# Patient Record
Sex: Female | Born: 1955 | Race: White | Hispanic: No | Marital: Married | Smoking: Never smoker
Health system: Southern US, Community
[De-identification: ages and names within clinical notes are randomized; demographics above are authoritative.]

## PROBLEM LIST (undated history)

## (undated) DIAGNOSIS — R7303 Prediabetes: Secondary | ICD-10-CM

## (undated) DIAGNOSIS — K589 Irritable bowel syndrome without diarrhea: Secondary | ICD-10-CM

## (undated) DIAGNOSIS — K219 Gastro-esophageal reflux disease without esophagitis: Secondary | ICD-10-CM

## (undated) DIAGNOSIS — E039 Hypothyroidism, unspecified: Secondary | ICD-10-CM

## (undated) DIAGNOSIS — D510 Vitamin B12 deficiency anemia due to intrinsic factor deficiency: Secondary | ICD-10-CM

## (undated) DIAGNOSIS — E782 Mixed hyperlipidemia: Secondary | ICD-10-CM

## (undated) HISTORY — DX: Vitamin B12 deficiency anemia due to intrinsic factor deficiency: D51.0

## (undated) HISTORY — DX: Mixed hyperlipidemia: E78.2

## (undated) HISTORY — DX: Hypothyroidism, unspecified: E03.9

## (undated) HISTORY — DX: Irritable bowel syndrome, unspecified: K58.9

## (undated) HISTORY — DX: Prediabetes: R73.03

## (undated) HISTORY — PX: ABDOMINAL HYSTERECTOMY: SHX81

---

## 1995-09-20 HISTORY — PX: TUBAL LIGATION: SHX77

## 1999-06-02 ENCOUNTER — Other Ambulatory Visit: Admission: RE | Admit: 1999-06-02 | Discharge: 1999-06-02 | Payer: Self-pay | Admitting: *Deleted

## 2001-06-11 ENCOUNTER — Ambulatory Visit (HOSPITAL_COMMUNITY): Admission: RE | Admit: 2001-06-11 | Discharge: 2001-06-11 | Payer: Self-pay | Admitting: Family Medicine

## 2001-06-11 ENCOUNTER — Encounter: Payer: Self-pay | Admitting: Family Medicine

## 2001-11-28 ENCOUNTER — Other Ambulatory Visit: Admission: RE | Admit: 2001-11-28 | Discharge: 2001-11-28 | Payer: Self-pay | Admitting: Obstetrics and Gynecology

## 2002-12-26 ENCOUNTER — Encounter: Payer: Self-pay | Admitting: Family Medicine

## 2002-12-26 ENCOUNTER — Ambulatory Visit (HOSPITAL_COMMUNITY): Admission: RE | Admit: 2002-12-26 | Discharge: 2002-12-26 | Payer: Self-pay | Admitting: Family Medicine

## 2003-06-03 ENCOUNTER — Encounter (INDEPENDENT_AMBULATORY_CARE_PROVIDER_SITE_OTHER): Payer: Self-pay | Admitting: Internal Medicine

## 2003-06-03 ENCOUNTER — Ambulatory Visit (HOSPITAL_COMMUNITY): Admission: RE | Admit: 2003-06-03 | Discharge: 2003-06-03 | Payer: Self-pay | Admitting: Internal Medicine

## 2003-08-21 ENCOUNTER — Ambulatory Visit (HOSPITAL_COMMUNITY): Admission: RE | Admit: 2003-08-21 | Discharge: 2003-08-21 | Payer: Self-pay | Admitting: Obstetrics and Gynecology

## 2003-08-21 ENCOUNTER — Encounter (INDEPENDENT_AMBULATORY_CARE_PROVIDER_SITE_OTHER): Payer: Self-pay | Admitting: *Deleted

## 2004-02-25 ENCOUNTER — Other Ambulatory Visit: Admission: RE | Admit: 2004-02-25 | Discharge: 2004-02-25 | Payer: Self-pay | Admitting: Obstetrics and Gynecology

## 2004-03-01 ENCOUNTER — Ambulatory Visit (HOSPITAL_COMMUNITY): Admission: RE | Admit: 2004-03-01 | Discharge: 2004-03-01 | Payer: Self-pay | Admitting: Obstetrics and Gynecology

## 2004-12-06 ENCOUNTER — Ambulatory Visit (HOSPITAL_COMMUNITY): Admission: RE | Admit: 2004-12-06 | Discharge: 2004-12-06 | Payer: Self-pay | Admitting: Family Medicine

## 2005-09-19 HISTORY — PX: DILATION AND CURETTAGE OF UTERUS: SHX78

## 2005-11-01 ENCOUNTER — Emergency Department (HOSPITAL_COMMUNITY): Admission: EM | Admit: 2005-11-01 | Discharge: 2005-11-01 | Payer: Self-pay | Admitting: Emergency Medicine

## 2010-04-15 ENCOUNTER — Ambulatory Visit (HOSPITAL_COMMUNITY)
Admission: RE | Admit: 2010-04-15 | Discharge: 2010-04-15 | Payer: Self-pay | Source: Home / Self Care | Admitting: Family Medicine

## 2010-10-04 ENCOUNTER — Ambulatory Visit (HOSPITAL_COMMUNITY)
Admission: RE | Admit: 2010-10-04 | Discharge: 2010-10-04 | Payer: Self-pay | Source: Home / Self Care | Attending: Family Medicine | Admitting: Family Medicine

## 2010-10-10 ENCOUNTER — Encounter: Payer: Self-pay | Admitting: Family Medicine

## 2011-01-31 ENCOUNTER — Ambulatory Visit (INDEPENDENT_AMBULATORY_CARE_PROVIDER_SITE_OTHER): Payer: BC Managed Care – PPO | Admitting: Gynecology

## 2011-01-31 ENCOUNTER — Other Ambulatory Visit (HOSPITAL_COMMUNITY)
Admission: RE | Admit: 2011-01-31 | Discharge: 2011-01-31 | Disposition: A | Discharge status: Home / Self Care | Payer: BC Managed Care – PPO | Source: Ambulatory Visit | Attending: Gynecology | Admitting: Gynecology

## 2011-01-31 ENCOUNTER — Other Ambulatory Visit: Payer: Self-pay | Admitting: Gynecology

## 2011-01-31 DIAGNOSIS — N950 Postmenopausal bleeding: Secondary | ICD-10-CM

## 2011-01-31 DIAGNOSIS — N952 Postmenopausal atrophic vaginitis: Secondary | ICD-10-CM

## 2011-01-31 DIAGNOSIS — Z124 Encounter for screening for malignant neoplasm of cervix: Secondary | ICD-10-CM | POA: Insufficient documentation

## 2011-01-31 DIAGNOSIS — Z833 Family history of diabetes mellitus: Secondary | ICD-10-CM

## 2011-02-01 ENCOUNTER — Other Ambulatory Visit: Payer: Self-pay | Admitting: Gynecology

## 2011-02-01 DIAGNOSIS — Z1231 Encounter for screening mammogram for malignant neoplasm of breast: Secondary | ICD-10-CM

## 2011-02-04 NOTE — Op Note (Signed)
[name], Rachel Branch                          ACCOUNT NO.:  [id number]   MEDICAL RECORD NO.:  [id number]                   PATIENT TYPE:  AMB   LOCATION:  SDC                                  FACILITY:  [facility]   PHYSICIAN:  Janine Limbo, M.D.            DATE OF BIRTH:  1956-02-26   DATE OF PROCEDURE:  08/21/2003  DATE OF DISCHARGE:                                 OPERATIVE REPORT   PREOPERATIVE DIAGNOSIS:  Irregular bleeding.   POSTOPERATIVE DIAGNOSIS:  Irregular bleeding.   PROCEDURE:  Hysteroscopy with dilatation and curettage.   SURGEON:  Janine Limbo, M.D.   ANESTHESIA:  Monitored anesthetic control, paracervical block using 0.5%  Marcaine with epinephrine.   DISPOSITION:  [name] is a 55 year old female, para 0-3-1-3, who presents  with irregular bleeding in spite of hormone therapy.  She understands the  indications for her procedure and she accepts the associated risks.   FINDINGS:  The endometrial cavity appeared normal.  The endometrial cavity  sounded to 9 cm.   DESCRIPTION OF PROCEDURE:  The patient was taken to the operating room,  where she was given medication through her IV line.  The patient's perineum  and vagina were prepped with multiple layers of Betadine.  A Foley catheter  was placed in the bladder.  Examination under anesthesia was performed.  The  patient was sterilely draped.  A speculum was placed in the vagina and a paracervical block was placed  using 10 mL of 0.5% Marcaine with epinephrine.  An endocervical curettage  was performed.  The uterus sounded to 9 cm.  The cervix was gradually  dilated.  The diagnostic hysteroscope was inserted and the cavity was  carefully inspected.  Pictures were taken of the endometrial cavity.  The  hysteroscope was removed and the cavity was then curetted using a medium  sharp curette.  The cavity was felt to be clean at the end of our procedure.  The hysteroscope was then reinserted and the cavity was  inspected.  No  lesions were present, and the cavity appeared to be clean.  All instruments  were removed.  The estimated blood loss was 10 mL.  Sponge, needle, and  instrument counts were correct.  The patient was returned to the supine  position and then she was taken to the recovery room in stable condition.  She tolerated her procedure well.   FOLLOW-UP INSTRUCTIONS:  The patient was given Darvocet-N 100, and she will  take one tablet every six hours as needed for pain.  She will use ibuprofen  400-600 mg q.6h. as needed for mild to moderate discomfort.  The patient  will return to see Dr. Stefano Gaul in two to three weeks for follow-up  examination.  She was given a copy of the postoperative instruction sheet as  prepared by the [facility] for patients who have  undergone a dilatation and curettage.  Janine Limbo, M.D.    AVS/MEDQ  D:  08/21/2003  T:  08/21/2003  Job:  [id number]

## 2011-02-04 NOTE — H&P (Signed)
NAME:  Rachel Branch, Rachel Branch                          ACCOUNT NO.:  [id number]   MEDICAL RECORD NO.:  [id number]                   PATIENT TYPE:  AMB   LOCATION:  SDC                                  FACILITY:  [facility]   PHYSICIAN:  Janine Limbo, M.D.            DATE OF BIRTH:  01/22/1956   DATE OF ADMISSION:  DATE OF DISCHARGE:                                HISTORY & PHYSICAL   DATE OF SURGERY:  August 21, 2003   HISTORY OF PRESENT ILLNESS:  Rachel Branch is a 55 year old female para 0-3-1-3  who presents for hysteroscopy and D&C because of irregular bleeding.  The  patient has had irregular bleeding in spite of oral contraceptives.  An  endometrial biopsy was performed that showed benign elements.  The patient's  TSH, FSH, and prolactin were normal.  Her most recent Pap smear was within  normal limits.  A CT scan was performed which showed diverticulosis but no  evidence of diverticulitis.   OBSTETRICAL HISTORY:  The patient has had three preterm vaginal deliveries  and one miscarriage.   PAST MEDICAL HISTORY:  The patient has been diagnosed in the past as  hypothyroid and she currently takes Synthroid.  She also takes B12  injections and Axert.  She complains of frequent headaches as well as  trouble with her stomach.  She has had a D&C in the past as well as  laparoscopy.  She has had a tubal ligation in the past.   DRUG ALLERGIES:  No known drug allergies.   SOCIAL HISTORY:  The patient denies cigarette use, alcohol use, and  recreational drug use.   REVIEW OF SYSTEMS:  The patient has a history of headaches as mentioned  above.  She also has PMS-type symptoms.   FAMILY HISTORY:  There is a family history of heart disease, thyroid  disease, migraine headaches, diabetes, hypertension, emotional issues, and  thromboembolic difficulties.   PHYSICAL EXAMINATION:  VITAL SIGNS:  Weight is 202 pounds.  HEENT:  Within normal limits.  CHEST:  Clear.  HEART:  Regular rate and  rhythm.  BREASTS:  Without masses.  ABDOMEN:  Nontender.  EXTREMITIES:  Within normal limits.  NEUROLOGIC:  Grossly normal.  PELVIC:  External genitalia is normal.  The vagina is normal except for  pelvic relaxation.  Cervix is nontender.  The uterus is upper limits normal  size and nontender.  Adnexa no masses and rectovaginal exam confirms.   ASSESSMENT:  Irregular menstrual bleeding.   PLAN:  The patient will undergo a hysteroscopy, dilatation, and curettage.  She understands the indications for her procedure and she accepts the risks  of, but not limited to, anesthetic complications, bleeding, infections, and  the possible damage to the surrounding organs.  Janine Limbo, M.D.    AVS/MEDQ  D:  08/16/2003  T:  08/16/2003  Job:  [id number]

## 2011-02-10 ENCOUNTER — Ambulatory Visit (HOSPITAL_COMMUNITY)
Admission: RE | Admit: 2011-02-10 | Discharge: 2011-02-10 | Disposition: A | Discharge status: Home / Self Care | Payer: BC Managed Care – PPO | Source: Ambulatory Visit | Attending: Gynecology | Admitting: Gynecology

## 2011-02-10 DIAGNOSIS — Z1231 Encounter for screening mammogram for malignant neoplasm of breast: Secondary | ICD-10-CM | POA: Insufficient documentation

## 2011-02-16 ENCOUNTER — Ambulatory Visit (INDEPENDENT_AMBULATORY_CARE_PROVIDER_SITE_OTHER): Payer: BC Managed Care – PPO | Admitting: Gynecology

## 2011-02-16 ENCOUNTER — Other Ambulatory Visit: Payer: Self-pay | Admitting: Gynecology

## 2011-02-16 ENCOUNTER — Other Ambulatory Visit: Payer: BC Managed Care – PPO

## 2011-02-16 DIAGNOSIS — N83339 Acquired atrophy of ovary and fallopian tube, unspecified side: Secondary | ICD-10-CM

## 2011-02-16 DIAGNOSIS — N950 Postmenopausal bleeding: Secondary | ICD-10-CM

## 2011-02-16 DIAGNOSIS — N840 Polyp of corpus uteri: Secondary | ICD-10-CM

## 2011-02-16 DIAGNOSIS — N949 Unspecified condition associated with female genital organs and menstrual cycle: Secondary | ICD-10-CM

## 2011-03-09 ENCOUNTER — Other Ambulatory Visit (HOSPITAL_COMMUNITY): Payer: Self-pay | Admitting: Endocrinology

## 2011-03-09 DIAGNOSIS — E049 Nontoxic goiter, unspecified: Secondary | ICD-10-CM

## 2011-03-14 HISTORY — PX: COMBINED HYSTEROSCOPY DIAGNOSTIC / D&C: SUR297

## 2011-03-17 ENCOUNTER — Other Ambulatory Visit: Payer: Self-pay | Admitting: Gynecology

## 2011-03-17 ENCOUNTER — Ambulatory Visit (HOSPITAL_BASED_OUTPATIENT_CLINIC_OR_DEPARTMENT_OTHER)
Admission: RE | Admit: 2011-03-17 | Discharge: 2011-03-17 | Disposition: A | Discharge status: Home / Self Care | Payer: BC Managed Care – PPO | Source: Ambulatory Visit | Attending: Gynecology | Admitting: Gynecology

## 2011-03-17 DIAGNOSIS — Z79899 Other long term (current) drug therapy: Secondary | ICD-10-CM | POA: Insufficient documentation

## 2011-03-17 DIAGNOSIS — N950 Postmenopausal bleeding: Secondary | ICD-10-CM | POA: Insufficient documentation

## 2011-03-17 DIAGNOSIS — E039 Hypothyroidism, unspecified: Secondary | ICD-10-CM | POA: Insufficient documentation

## 2011-03-17 DIAGNOSIS — K589 Irritable bowel syndrome without diarrhea: Secondary | ICD-10-CM | POA: Insufficient documentation

## 2011-03-17 DIAGNOSIS — D510 Vitamin B12 deficiency anemia due to intrinsic factor deficiency: Secondary | ICD-10-CM | POA: Insufficient documentation

## 2011-03-17 DIAGNOSIS — N840 Polyp of corpus uteri: Secondary | ICD-10-CM

## 2011-03-17 DIAGNOSIS — R9431 Abnormal electrocardiogram [ECG] [EKG]: Secondary | ICD-10-CM | POA: Insufficient documentation

## 2011-03-17 LAB — POCT HEMOGLOBIN-HEMACUE: Hemoglobin: 13.7 g/dL (ref 12.0–15.0)

## 2011-03-18 NOTE — Op Note (Signed)
  Rachel Branch, Rachel Branch                ACCOUNT NO.:  [id number]  MEDICAL RECORD NO.:  [id number]  LOCATION:                                 FACILITY:  PHYSICIAN:  Baillie Mohammad P. [place], M.D.DATE OF BIRTH:  04-Apr-1956  DATE OF PROCEDURE:  03/17/2011 DATE OF DISCHARGE:                              OPERATIVE REPORT   PREOPERATIVE DIAGNOSES:  Postmenopausal bleeding, endometrial polyp.  POSTOPERATIVE DIAGNOSES:  Postmenopausal bleeding, endometrial polyp.  PROCEDURES:  Hysteroscopy, endometrial polypectomy, dilatation and curettage.  SURGEON:  Teshawn Moan P. Masako Overall, M.D.  ANESTHESIA:  General with 1% lidocaine paracervical block.  COMPLICATIONS:  None.  ESTIMATED BLOOD LOSS:  Minimal.  DISTENDED MEDIA DISCREPANCY:  Minimal.  SPECIMENS: 1. Endometrial polyp. 2. Endometrial curetting to pathology.  FINDINGS:  EUA, external BUS and vagina normal.  Cervix normal. Bimanual uterus normal sized, midline, mobile.  Adnexa without masses. Hysteroscopic, large endometrial polyp filling the cavity.  After removal, endometrial cavity was empty.  Postmenopausal appearance, noting fundus anterior-posterior uterine surface and lower uterine segment, endocervical canal, right and left tubal ostia all visualized.  DESCRIPTION OF PROCEDURE:  The patient was taken to the operating room, underwent general anesthesia, placed in low dorsal lithotomy position, received a perineal vaginal preparation with Betadine solution.  Bladder emptied with in-and-out Foley catheterization.  EUA performed.  The patient was draped in usual fashion.  The cervix was visualized with a weighted speculum.  Anterior lip grasped with single-tooth tenaculum. Paracervical block using 1% lidocaine was placed, total of 10 cc.  The cervix was gently dilated to admit the operative hysteroscope and hysteroscopy was performed with findings noted above.  The patient had a large endometrial polyp filling the cavity.  The  hysteroscope was removed.  Small ring forceps introduced into the uterine cavity, opened and closed and the polyp was removed in its entirety.  A sharp curettage was then performed and the specimen was sent separately to pathology. Hysteroscopy showed an empty cavity, good distention, no evidence of perforation.  The instruments were removed.  Hemostasis visualized from the cervical os and the tenaculum site.  The speculum removed.  The patient placed in supine position, received intraoperative Toradol and was awakened without difficulty and taken to recovery room in good condition having tolerated the procedure well.     Massa Pe P. Audie Box, M.D.     TPF/MEDQ  D:  03/17/2011  T:  03/17/2011  Job:  [id number]  Electronically Signed by Colin Broach M.D. on 03/18/2011 06:49:02 PM

## 2011-03-18 NOTE — H&P (Signed)
  Rachel, Branch                ACCOUNT NO.:  [id number]  MEDICAL RECORD NO.:  [id number]  LOCATION:                                 FACILITY:  PHYSICIAN:  Emmarie Sannes P. Jacquiline Zurcher, M.D.DATE OF BIRTH:  1956/06/08  DATE OF ADMISSION: DATE OF DISCHARGE:                             HISTORY & PHYSICAL   Being admitted to [facility] on March 17, 2011, at 7:30 a.m. for surgery.  CHIEF COMPLAINT:  Postmenopausal bleeding.  HISTORY OF PRESENT ILLNESS:  55 year old G66, P3, AB1 female status post BTL, history of menopause approximately a year and a half before presentation, who presented with a 38-month history of bleeding on and off.  The patient's outpatient evaluation included elevated FSH and a sonohysterogram showing several endometrial defects consistent with polyps.  The patient is admitted at this time for hysteroscopy, D and C, removal of her endometrial polyps.  PAST MEDICAL HISTORY:  Includes irritable bowel syndrome, hypothyroidism, pernicious anemia.  PAST SURGICAL HISTORY:  Includes D and C in 2007, BTL in 1997.  CURRENT MEDICATIONS:  Include B12 shots, Synthroid 0.125 daily.  ALLERGIES:  No medications.  REVIEW OF SYSTEMS:  Noncontributory.  FAMILY HISTORY:  Noncontributory.  SOCIAL HISTORY:  Noncontributory.  PHYSICAL EXAMINATION:  VITAL SIGNS:  Admission physical exam, afebrile. Vital signs are stable. HEENT:  Normal. LUNGS:  Clear. CARDIAC:  Regular rate without rubs, murmurs, or gallops. ABDOMINAL EXAM:  Benign. PELVIC:  External BUS, vagina with mild atrophic changes.  Cervix grossly normal.  Uterus normal size, midline, mobile, nontender.  Adnexa without masses or tenderness.  ASSESSMENT:  A 55 year old G19, P3, AB1 female status post tubal sterilization over a year and a half, postmenopausal with on and off bleeding for 3 months.  Sonohysterogram consistent with endometrial polyps and she is admitted for hysteroscopy, D and C.  I reviewed  the proposed surgery with the patient to include the expected intraoperative and postoperative courses, instrumentation, use of the hysteroscope, resectoscope and D and C portion.  The risks of infection, prolonged antibiotics, hemorrhage necessitating transfusion.  The risks of transfusion to include transfusion reaction, hepatitis, HIV, mad cow disease and other unknown entities were all discussed, understood, and accepted.  The risk of uterine perforation, damage to internal organs either immediately recognized or delay recognized including bowel, bladder, ureters, vessels, and nerves necessitating major exploratory reparative surgeries and future reparative surgeries including ostomy formation, bowel resection, bladder repair, ureteral damage repair was all discussed, understood and accepted.  The issues of distended media absorption leading to metabolic complications such as comas and seizures were also reviewed with her.  Her questions were answered and she is ready to proceed with surgery.     Symphoni Helbling P. Audie Box, M.D.     TPF/MEDQ  D:  03/14/2011  T:  03/14/2011  Job:  [id number]  Electronically Signed by Colin Broach M.D. on 03/18/2011 06:48:55 PM

## 2011-03-31 ENCOUNTER — Encounter: Payer: Self-pay | Admitting: Gynecology

## 2011-03-31 ENCOUNTER — Ambulatory Visit (INDEPENDENT_AMBULATORY_CARE_PROVIDER_SITE_OTHER): Payer: BC Managed Care – PPO | Admitting: Gynecology

## 2011-03-31 DIAGNOSIS — Z9889 Other specified postprocedural states: Secondary | ICD-10-CM

## 2011-05-09 ENCOUNTER — Ambulatory Visit (HOSPITAL_COMMUNITY)
Admission: RE | Admit: 2011-05-09 | Discharge: 2011-05-09 | Disposition: A | Discharge status: Home / Self Care | Payer: BC Managed Care – PPO | Source: Ambulatory Visit | Attending: Endocrinology | Admitting: Endocrinology

## 2011-05-09 DIAGNOSIS — E049 Nontoxic goiter, unspecified: Secondary | ICD-10-CM | POA: Insufficient documentation

## 2012-05-02 ENCOUNTER — Other Ambulatory Visit: Payer: Self-pay | Admitting: Gynecology

## 2012-05-02 DIAGNOSIS — Z1231 Encounter for screening mammogram for malignant neoplasm of breast: Secondary | ICD-10-CM

## 2012-05-09 ENCOUNTER — Ambulatory Visit (INDEPENDENT_AMBULATORY_CARE_PROVIDER_SITE_OTHER): Payer: BC Managed Care – PPO | Admitting: Gynecology

## 2012-05-09 ENCOUNTER — Encounter: Payer: Self-pay | Admitting: Gynecology

## 2012-05-09 VITALS — BP 120/90 | Ht 65.75 in | Wt 224.0 lb

## 2012-05-09 DIAGNOSIS — Z780 Asymptomatic menopausal state: Secondary | ICD-10-CM

## 2012-05-09 DIAGNOSIS — Z01419 Encounter for gynecological examination (general) (routine) without abnormal findings: Secondary | ICD-10-CM

## 2012-05-09 DIAGNOSIS — Z13220 Encounter for screening for lipoid disorders: Secondary | ICD-10-CM

## 2012-05-09 DIAGNOSIS — Z131 Encounter for screening for diabetes mellitus: Secondary | ICD-10-CM

## 2012-05-09 LAB — CBC WITH DIFFERENTIAL/PLATELET
Basophils Absolute: 0 K/uL (ref 0.0–0.1)
Basophils Relative: 0 % (ref 0–1)
Eosinophils Absolute: 0.2 K/uL (ref 0.0–0.7)
Eosinophils Relative: 2 % (ref 0–5)
HCT: 42.1 % (ref 36.0–46.0)
Hemoglobin: 14.4 g/dL (ref 12.0–15.0)
Lymphocytes Relative: 19 % (ref 12–46)
Lymphs Abs: 2.1 K/uL (ref 0.7–4.0)
MCH: 30.3 pg (ref 26.0–34.0)
MCHC: 34.2 g/dL (ref 30.0–36.0)
MCV: 88.4 fL (ref 78.0–100.0)
Monocytes Absolute: 0.9 K/uL (ref 0.1–1.0)
Monocytes Relative: 8 % (ref 3–12)
Neutro Abs: 7.9 K/uL — ABNORMAL HIGH (ref 1.7–7.7)
Neutrophils Relative %: 71 % (ref 43–77)
Platelets: 325 K/uL (ref 150–400)
RBC: 4.76 MIL/uL (ref 3.87–5.11)
RDW: 14 % (ref 11.5–15.5)
WBC: 11.1 K/uL — ABNORMAL HIGH (ref 4.0–10.5)

## 2012-05-09 NOTE — Patient Instructions (Signed)
Follow up for bone density as scheduled. Follow up for annual gynecologic exam. Schedule your colonoscopy as recommended. Follow up for your mammogram as scheduled.

## 2012-05-09 NOTE — Progress Notes (Signed)
STAYCE DELANCY 02-17-1956 [id number]        56 y.o.  G4P0013 for annual exam.  Several issues noted below.  Past medical history,surgical history, medications, allergies, family history and social history were all reviewed and documented in the EPIC chart. ROS:  Was performed and pertinent positives and negatives are included in the history.  Exam: Amy assistant Filed Vitals:   05/09/12 0958  BP: 120/90  Height: 5' 5.75" (1.67 m)  Weight: 224 lb (101.606 kg)   General appearance  Normal Skin grossly normal Head/Neck normal with no cervical or supraclavicular adenopathy thyroid normal Lungs  clear Cardiac RR, without RMG Abdominal  soft, nontender, without masses, organomegaly or hernia Breasts  examined lying and sitting without masses, retractions, discharge or axillary adenopathy. Pelvic  Ext/BUS/vagina  normal mild atrophic changes  Cervix  normal   Uterus  anteverted, normal size, shape and contour, midline and mobile nontender   Adnexa  Without masses or tenderness    Anus and perineum  normal   Rectovaginal  normal sphincter tone without palpated masses or tenderness.    Assessment/Plan:  56 y.o. G37P0013 female for annual examI, status post BTL.   1. Postmenopausal. No significant flushes, night sweats or bleeding. Continue to monitor. 2. History endometrial polyp. Status post hysteroscopy D&C June 2012. No bleeding since then. We'll continue to monitor. 3. Mammography. Patient has scheduled for next week. Will continue with annual mammography.  SBE monthly. 4. Pap smear. Last Pap smear 2012. No Pap smear done today.  No history of abnormal Pap smears before. We'll plan every 3-5 your screening per current screening guidelines. 5. Dexa. Patient has never had dexa. We'll schedule baseline now. She does have a history of maternal osteoporosis with hip fracture. Increase calcium vitamin d reviewed.  Check vitamin D level. 6. Colonoscopy. Patient's colonoscopy. I reviewed the  benefit of early detection and removal of precancerous adenomatous. I strongly urged her to schedule this and she agrees to arrange this at a local facility near her home that she knows of. 7. Health maintenance. Patient's been followed by Dr. Horald Pollen for her hypothyroidism. She is unsure whether lab work is done there routinely. Has not seen her primary for some time. Will check baseline CBC glucose lipid profile urinalysis along with her vitamin D. Follow up in one year, sooner as needed.    Dara Lords MD, 10:32 AM 05/09/2012

## 2012-05-10 LAB — URINALYSIS W MICROSCOPIC + REFLEX CULTURE
Bacteria, UA: NONE SEEN
Bilirubin Urine: NEGATIVE
Casts: NONE SEEN
Hgb urine dipstick: NEGATIVE
Specific Gravity, Urine: 1.021 (ref 1.005–1.030)
Squamous Epithelial / LPF: NONE SEEN
pH: 5.5 (ref 5.0–8.0)

## 2012-05-10 LAB — LIPID PANEL
HDL: 49 mg/dL (ref >39)
LDL Cholesterol: 194 mg/dL — ABNORMAL HIGH (ref 0–99)
VLDL: 35 mg/dL (ref 0–40)

## 2012-05-11 ENCOUNTER — Other Ambulatory Visit: Payer: Self-pay | Admitting: Gynecology

## 2012-05-11 DIAGNOSIS — E7800 Pure hypercholesterolemia, unspecified: Secondary | ICD-10-CM

## 2012-05-11 DIAGNOSIS — R739 Hyperglycemia, unspecified: Secondary | ICD-10-CM

## 2012-05-15 ENCOUNTER — Other Ambulatory Visit: Payer: Self-pay | Admitting: Gynecology

## 2012-05-15 ENCOUNTER — Other Ambulatory Visit: Payer: BC Managed Care – PPO

## 2012-05-15 DIAGNOSIS — E7800 Pure hypercholesterolemia, unspecified: Secondary | ICD-10-CM

## 2012-05-15 DIAGNOSIS — R739 Hyperglycemia, unspecified: Secondary | ICD-10-CM

## 2012-05-15 LAB — LIPID PANEL
Cholesterol: 250 mg/dL — ABNORMAL HIGH (ref 0–200)
HDL: 44 mg/dL (ref >39)
LDL Cholesterol: 165 mg/dL — ABNORMAL HIGH (ref 0–99)
Total CHOL/HDL Ratio: 5.7 Ratio
Triglycerides: 203 mg/dL — ABNORMAL HIGH (ref <150)

## 2012-05-15 LAB — GLUCOSE, RANDOM: Glucose, Bld: 93 mg/dL (ref 70–99)

## 2012-05-16 ENCOUNTER — Other Ambulatory Visit (HOSPITAL_COMMUNITY): Payer: Self-pay | Admitting: Endocrinology

## 2012-05-16 DIAGNOSIS — E041 Nontoxic single thyroid nodule: Secondary | ICD-10-CM

## 2012-05-16 LAB — TSH: TSH: 0.651 u[IU]/mL (ref 0.350–4.500)

## 2012-05-17 ENCOUNTER — Ambulatory Visit (HOSPITAL_COMMUNITY)
Admission: RE | Admit: 2012-05-17 | Discharge: 2012-05-17 | Disposition: A | Discharge status: Home / Self Care | Payer: BC Managed Care – PPO | Source: Ambulatory Visit | Attending: Gynecology | Admitting: Gynecology

## 2012-05-17 ENCOUNTER — Encounter: Payer: Self-pay | Admitting: *Deleted

## 2012-05-17 DIAGNOSIS — Z1231 Encounter for screening mammogram for malignant neoplasm of breast: Secondary | ICD-10-CM

## 2012-05-17 NOTE — Progress Notes (Unsigned)
Patient ID: Rachel Branch, female   DOB: 05/29/1956, 56 y.o.   MRN: [id number] Joy from [facility] office called requesting recent TSH results, results sent to her office.

## 2012-05-22 ENCOUNTER — Ambulatory Visit (HOSPITAL_COMMUNITY)
Admission: RE | Admit: 2012-05-22 | Discharge: 2012-05-22 | Disposition: A | Discharge status: Home / Self Care | Payer: BC Managed Care – PPO | Source: Ambulatory Visit | Attending: Endocrinology | Admitting: Endocrinology

## 2012-05-22 ENCOUNTER — Other Ambulatory Visit (HOSPITAL_COMMUNITY): Payer: BC Managed Care – PPO

## 2012-05-22 ENCOUNTER — Ambulatory Visit (INDEPENDENT_AMBULATORY_CARE_PROVIDER_SITE_OTHER): Payer: BC Managed Care – PPO

## 2012-05-22 DIAGNOSIS — Z780 Asymptomatic menopausal state: Secondary | ICD-10-CM

## 2012-05-22 DIAGNOSIS — E041 Nontoxic single thyroid nodule: Secondary | ICD-10-CM

## 2012-05-22 DIAGNOSIS — Z13820 Encounter for screening for osteoporosis: Secondary | ICD-10-CM

## 2012-05-22 DIAGNOSIS — E049 Nontoxic goiter, unspecified: Secondary | ICD-10-CM | POA: Insufficient documentation

## 2012-12-20 ENCOUNTER — Other Ambulatory Visit (HOSPITAL_COMMUNITY): Payer: Self-pay | Admitting: Pulmonary Disease

## 2012-12-20 DIAGNOSIS — R109 Unspecified abdominal pain: Secondary | ICD-10-CM

## 2012-12-25 ENCOUNTER — Ambulatory Visit (HOSPITAL_COMMUNITY)
Admission: RE | Admit: 2012-12-25 | Discharge: 2012-12-25 | Disposition: A | Discharge status: Home / Self Care | Payer: BC Managed Care – PPO | Source: Ambulatory Visit | Attending: Pulmonary Disease | Admitting: Pulmonary Disease

## 2012-12-25 DIAGNOSIS — R109 Unspecified abdominal pain: Secondary | ICD-10-CM

## 2012-12-25 DIAGNOSIS — R1011 Right upper quadrant pain: Secondary | ICD-10-CM | POA: Insufficient documentation

## 2012-12-25 DIAGNOSIS — K7689 Other specified diseases of liver: Secondary | ICD-10-CM | POA: Insufficient documentation

## 2013-01-09 ENCOUNTER — Other Ambulatory Visit (HOSPITAL_COMMUNITY): Payer: Self-pay | Admitting: Pulmonary Disease

## 2013-01-09 DIAGNOSIS — R1011 Right upper quadrant pain: Secondary | ICD-10-CM

## 2013-01-10 ENCOUNTER — Encounter (HOSPITAL_COMMUNITY)
Admission: RE | Admit: 2013-01-10 | Discharge: 2013-01-10 | Disposition: A | Discharge status: Home / Self Care | Payer: BC Managed Care – PPO | Source: Ambulatory Visit | Attending: Pulmonary Disease | Admitting: Pulmonary Disease

## 2013-01-10 ENCOUNTER — Encounter (HOSPITAL_COMMUNITY): Payer: Self-pay

## 2013-01-10 DIAGNOSIS — R1011 Right upper quadrant pain: Secondary | ICD-10-CM | POA: Insufficient documentation

## 2013-01-10 MED ORDER — SINCALIDE 5 MCG IJ SOLR
2.0500 ug | Freq: Once | INTRAMUSCULAR | Status: AC
  Administered 2013-01-10: 2.05 ug via INTRAVENOUS

## 2013-01-10 MED ORDER — TECHNETIUM TC 99M MEBROFENIN IV KIT
5.0000 | PACK | Freq: Once | INTRAVENOUS | Status: AC | PRN
  Administered 2013-01-10: 5 via INTRAVENOUS

## 2013-07-11 ENCOUNTER — Other Ambulatory Visit (HOSPITAL_COMMUNITY): Payer: Self-pay | Admitting: Pulmonary Disease

## 2013-07-11 ENCOUNTER — Ambulatory Visit (HOSPITAL_COMMUNITY)
Admission: RE | Admit: 2013-07-11 | Discharge: 2013-07-11 | Disposition: A | Discharge status: Home / Self Care | Payer: BC Managed Care – PPO | Source: Ambulatory Visit | Attending: Pulmonary Disease | Admitting: Pulmonary Disease

## 2013-07-11 DIAGNOSIS — M545 Low back pain, unspecified: Secondary | ICD-10-CM | POA: Insufficient documentation

## 2013-07-11 DIAGNOSIS — M5137 Other intervertebral disc degeneration, lumbosacral region: Secondary | ICD-10-CM | POA: Insufficient documentation

## 2013-07-11 DIAGNOSIS — M51379 Other intervertebral disc degeneration, lumbosacral region without mention of lumbar back pain or lower extremity pain: Secondary | ICD-10-CM | POA: Insufficient documentation

## 2013-07-26 ENCOUNTER — Other Ambulatory Visit (HOSPITAL_COMMUNITY): Payer: Self-pay | Admitting: Pulmonary Disease

## 2013-07-26 DIAGNOSIS — E0590 Thyrotoxicosis, unspecified without thyrotoxic crisis or storm: Secondary | ICD-10-CM

## 2013-07-30 ENCOUNTER — Ambulatory Visit (HOSPITAL_COMMUNITY)
Admission: RE | Admit: 2013-07-30 | Discharge: 2013-07-30 | Disposition: A | Discharge status: Home / Self Care | Payer: BC Managed Care – PPO | Source: Ambulatory Visit | Attending: Pulmonary Disease | Admitting: Pulmonary Disease

## 2013-07-30 ENCOUNTER — Other Ambulatory Visit: Payer: Self-pay | Admitting: Gynecology

## 2013-07-30 DIAGNOSIS — E0590 Thyrotoxicosis, unspecified without thyrotoxic crisis or storm: Secondary | ICD-10-CM

## 2013-07-30 DIAGNOSIS — Z139 Encounter for screening, unspecified: Secondary | ICD-10-CM

## 2013-07-30 DIAGNOSIS — E039 Hypothyroidism, unspecified: Secondary | ICD-10-CM | POA: Insufficient documentation

## 2013-07-30 DIAGNOSIS — E041 Nontoxic single thyroid nodule: Secondary | ICD-10-CM | POA: Insufficient documentation

## 2013-08-05 ENCOUNTER — Ambulatory Visit (HOSPITAL_COMMUNITY)
Admission: RE | Admit: 2013-08-05 | Discharge: 2013-08-05 | Disposition: A | Discharge status: Home / Self Care | Payer: BC Managed Care – PPO | Source: Ambulatory Visit | Attending: Gynecology | Admitting: Gynecology

## 2013-08-05 DIAGNOSIS — Z139 Encounter for screening, unspecified: Secondary | ICD-10-CM

## 2013-08-05 DIAGNOSIS — Z1231 Encounter for screening mammogram for malignant neoplasm of breast: Secondary | ICD-10-CM | POA: Insufficient documentation

## 2013-08-23 ENCOUNTER — Other Ambulatory Visit (HOSPITAL_COMMUNITY)
Admission: RE | Admit: 2013-08-23 | Discharge: 2013-08-23 | Disposition: A | Discharge status: Home / Self Care | Payer: BC Managed Care – PPO | Source: Ambulatory Visit | Attending: Gynecology | Admitting: Gynecology

## 2013-08-23 ENCOUNTER — Ambulatory Visit (INDEPENDENT_AMBULATORY_CARE_PROVIDER_SITE_OTHER): Payer: BC Managed Care – PPO | Admitting: Women's Health

## 2013-08-23 ENCOUNTER — Encounter: Payer: Self-pay | Admitting: Women's Health

## 2013-08-23 VITALS — BP 116/78 | Ht 65.75 in | Wt 219.0 lb

## 2013-08-23 DIAGNOSIS — Z01419 Encounter for gynecological examination (general) (routine) without abnormal findings: Secondary | ICD-10-CM

## 2013-08-23 DIAGNOSIS — N950 Postmenopausal bleeding: Secondary | ICD-10-CM

## 2013-08-23 NOTE — Patient Instructions (Signed)
Postmenopausal Bleeding Menopause is commonly referred to as the "change in life." It is a time when the fertile years, the time of ovulating and having menstrual periods, has come to an end. It is also determined by not having menstrual periods for 12 months.  Postmenopausal bleeding is any bleeding a woman has after she has entered into menopause. Any type of postmenopausal bleeding, even if it appears to be a typical menstrual period, is concerning. This should be evaluated by your caregiver.  CAUSES   Hormone therapy.  Cancer of the cervix or cancer of the lining of the uterus (endometrial cancer).  Thinning of the uterine lining (uterine atrophy).  Thyroid diseases.  Certain medicines.  Infection of the uterus or cervix.  Inflammation or irritation of the uterine lining (endometritis).  Estrogen-secreting tumors.  Growths (polyps) on the cervix, uterine lining, or uterus.  Uterine tumors (fibroids).  Being very overweight (obese). DIAGNOSIS  Your caregiver will take a medical history and ask questions. A physical exam will also be performed. Further tests may include:   A transvaginal ultrasound. An ultrasound wand or probe is inserted into your vagina to view the pelvic organs.  A biopsy of the lining of the uterus (endometrium). A sample of the endometrium is removed and examined.  A hysteroscopy. Your caregiver may use an instrument with a light and a camera attached to it (hysteroscope). The hysteroscope is used to look inside the uterus for problems.  A dilation and curettage (D&C). Tissue is removed from the uterine lining to be examined for problems. TREATMENT  Treatment depends on the cause of the bleeding. Some treatments include:   Surgery.  Medicines.  Hormones.  A hysteroscopy or D&C to remove polyps or fibroids.  Changing or stopping a current medicine you are taking. Talk to your caregiver about your specific treatment. HOME CARE INSTRUCTIONS    Maintain a healthy weight.  Keep regular pelvic exams and Pap tests. SEEK MEDICAL CARE IF:   You have bleeding, even if it is light in comparison to your previous periods.  Your bleeding lasts more than 1 week.  You have abdominal pain.  You develop bleeding with sexual intercourse. SEEK IMMEDIATE MEDICAL CARE IF:   You have a fever, chills, headache, dizziness, muscle aches, and bleeding.  You have severe pain with bleeding.  You are passing blood clots.  You have bleeding and need more than 1 pad an hour.  You feel faint. MAKE SURE YOU:  Understand these instructions.  Will watch your condition.  Will get help right away if you are not doing well or get worse. Document Released: 12/14/2005 Document Revised: 11/28/2011 Document Reviewed: 04/04/2013 [employer] Patient Information 2014 [employer].  

## 2013-08-23 NOTE — Progress Notes (Signed)
MILLI WOOLRIDGE 13-Sep-1956 [id number]    History:    The patient presents for annual exam.  No bleeding since 2012 until November 2014 and had 3 weeks of bleeding with clots. Benign endometrial polyp 2012. BTL. Normal Pap and mammogram history. Normal DEXA 2013. IBS/hyperthyroidism/anemia-primary care manages.  Past medical history, past surgical history, family history and social history were all reviewed and documented in the EPIC chart. 3 daughters, homeschooled all doing well. Mother diabetes/hypertension/heart disease. Father hypertension.   ROS:  A  ROS was performed and pertinent positives and negatives are included in the history.  Exam:  Filed Vitals:   08/23/13 1100  BP: 116/78    General appearance:  Normal Head/Neck:  Normal, without cervical or supraclavicular adenopathy. Thyroid:  Symmetrical, normal in size, without palpable masses or nodularity. Respiratory  Effort:  Normal  Auscultation:  Clear without wheezing or rhonchi Cardiovascular  Auscultation:  Regular rate, without rubs, murmurs or gallops  Edema/varicosities:  Not grossly evident Abdominal  Soft,nontender, without masses, guarding or rebound.  Liver/spleen:  No organomegaly noted  Hernia:  None appreciated  Skin  Inspection:  Grossly normal  Palpation:  Grossly normal Neurologic/psychiatric  Orientation:  Normal with appropriate conversation.  Mood/affect:  Normal  Genitourinary    Breasts: Examined lying and sitting.     Right: Without masses, retractions, discharge or axillary adenopathy.     Left: Without masses, retractions, discharge or axillary adenopathy.   Inguinal/mons:  Normal without inguinal adenopathy  External genitalia:  Normal  BUS/Urethra/Skene's glands:  Normal  Bladder:  Normal  Vagina:  Normal  Cervix:  Normal  Uterus:   normal in size, shape and contour.  Midline and mobile  Adnexa/parametria:     Rt: Without masses or tenderness.   Lt: Without masses or  tenderness.  Anus and perineum: Normal  Digital rectal exam: Normal sphincter tone without palpated masses or tenderness  Assessment/Plan:  56 y.o. MWF G3P3 for annual exam.   3 week episode of postmenopausal bleeding November 2014 Obesity Hypothyroid/IBS/pernicious anemia-primary care manages labs and meds  Plan: Sonohysterogram with biopsy, will schedule with Dr. Audie Box. SBE's, continue annual mammogram, calcium and iron rich diet encouraged. Colonoscopy, instructed to schedule. Encouraged to increase regular exercise and decrease calories for weight loss. Pap. Pap normal 2012, new screening guidelines reviewed.    Harrington Challenger [facility], 12:51 PM 08/23/2013

## 2013-09-05 ENCOUNTER — Other Ambulatory Visit: Payer: Self-pay | Admitting: Women's Health

## 2013-09-05 ENCOUNTER — Other Ambulatory Visit: Payer: Self-pay | Admitting: Gynecology

## 2013-09-05 DIAGNOSIS — N950 Postmenopausal bleeding: Secondary | ICD-10-CM

## 2013-09-06 ENCOUNTER — Encounter: Payer: Self-pay | Admitting: Gynecology

## 2013-09-06 ENCOUNTER — Ambulatory Visit (INDEPENDENT_AMBULATORY_CARE_PROVIDER_SITE_OTHER): Payer: BC Managed Care – PPO | Admitting: Gynecology

## 2013-09-06 ENCOUNTER — Ambulatory Visit (INDEPENDENT_AMBULATORY_CARE_PROVIDER_SITE_OTHER): Payer: BC Managed Care – PPO

## 2013-09-06 DIAGNOSIS — N950 Postmenopausal bleeding: Secondary | ICD-10-CM

## 2013-09-06 DIAGNOSIS — N840 Polyp of corpus uteri: Secondary | ICD-10-CM

## 2013-09-06 NOTE — Patient Instructions (Signed)
Followup for surgery as arranged by office staff

## 2013-09-06 NOTE — Progress Notes (Signed)
Patient presents for sonohysterogram due to history of postmenopausal bleeding. Reports last menstrual period approximately 2012 up until this past November when she started bleeding on and off to the point of passing clots. Does have a history of endometrial polyps status post hysteroscopic resection 02/2011.  Exam external BUS vagina with atrophic changes. Cervix normal. Uterus grossly normal in size midline mobile nontender. Adnexa without masses or tenderness.  Ultrasound shows uterus normal size and echotexture. Endometrial echo 8.5 mm. Right and left ovaries visualized and postmenopausal in appearance. Cul-de-sac negative.  Sonohysterogram performed, sterile technique, easy catheter introduction, good distention with endometrial polyps x2 measuring 12 x 7 mm and 12 x 7 mm. Endometrial sample taken. Patient tolerated well.  Assessment and plan: Postmenopausal bleeding with sonohysterogram consistent with endometrial polyps. History of same before. Reviewed with patient and her husband with recommendation to proceed with hysteroscopy D&C, resection of endometrial polyps. I reviewed which involved with the procedure to include the expected intraoperative and postoperative courses as well as the recovery period. Instrumentation to include the use of the resectoscope, hysteroscopy and D&C portion. The risks to include infection, prolonged antibiotics, hemorrhage necessitating transfusion the risk of transfusion, uterine perforation and damage to internal organs including bowel, bladder, ureters, nerves necessitating major exploratory surgery and future reparative surgeries including bowel resection, ostomy formation, bladder repair, ureteral damage repair. Distended media absorption leading to fluid overload, metabolic complications such as coma and seizures was also discussed. Lastly no guarantees that she will not have recurrent polyps in the future was also reviewed. Patient's questions were answered and  we'll move toward scheduling the surgery.

## 2013-09-10 ENCOUNTER — Other Ambulatory Visit: Payer: Self-pay | Admitting: Gynecology

## 2013-09-10 MED ORDER — MISOPROSTOL 200 MCG PO TABS: ORAL_TABLET | ORAL | Status: DC

## 2013-09-16 ENCOUNTER — Encounter (HOSPITAL_COMMUNITY): Payer: Self-pay | Admitting: Pharmacist

## 2013-09-17 ENCOUNTER — Telehealth: Payer: Self-pay

## 2013-09-17 ENCOUNTER — Ambulatory Visit (INDEPENDENT_AMBULATORY_CARE_PROVIDER_SITE_OTHER): Payer: BC Managed Care – PPO | Admitting: Gynecology

## 2013-09-17 ENCOUNTER — Encounter: Payer: Self-pay | Admitting: Gynecology

## 2013-09-17 DIAGNOSIS — N950 Postmenopausal bleeding: Secondary | ICD-10-CM

## 2013-09-17 MED ORDER — DEXTROSE 5 % IV SOLN
2.0000 g | INTRAVENOUS | Status: AC
  Administered 2013-09-18: 2 g via INTRAVENOUS
  Filled 2013-09-17: qty 2

## 2013-09-17 NOTE — H&P (Signed)
  Rachel Branch 18-Feb-1956 [id number]   History and Physical  Chief complaint: Postmenopausal bleeding, endometrial polyp  History of present illness: 57 y.o. G4P0013 with history of postmenopausal bleeding. She was amenorrheic since 2012 with onset of 3 weeks of vaginal bleeding November 2014, heavy at times with passage of clots. History of benign endometrial polypectomy 2012.  Sonohysterogram showed a normal-appearing uterus in size and echotexture. Endometrial echo 8.5 mm. Right and left ovaries were visualized and postmenopausal in appearance. Cul-de-sac was negative. 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 It was the pathologists impression that the complex hyperplasia involved the polyps. Patient is admitted for hysteroscopy D&C with removal of the endometrial polyps.  Past medical history,surgical history, medications, allergies, family history and social history were all reviewed and documented in the EPIC chart. ROS:  Was performed and pertinent positives and negatives are included in the history of present illness.  Exam:  [name] General: well developed, well nourished female, no acute distress HEENT: normal  Lungs: clear to auscultation without wheezing, rales or rhonchi  Cardiac: regular rate without rubs, murmurs or gallops  Abdomen: soft, nontender without masses, guarding, rebound, organomegaly  Pelvic: external bus vagina: normal   Cervix: grossly normal  Uterus: normal size, midline and mobile, nontender  Adnexa: without masses or tenderness      Assessment/Plan:  57 y.o. 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 I reviewed again with patient and her husband I recommendation to  proceed with hysteroscopy D&C, resection of endometrial polyps. I reviewed what is involved with the procedure to include the expected intraoperative and postoperative courses as well as the recovery period. Instrumentation to include the use of the resectoscope, hysteroscopy and D&C portion was reviewed. The risks to include infection, prolonged antibiotics, hemorrhage necessitating transfusion and the risks of transfusion including transfusion reaction, hepatitis, HIV, mad cow disease and other unknown entities. The risk of uterine perforation and damage to internal organs, either immediately recognized or delay recognized, including bowel, bladder, ureters, vessels and nerves necessitating major exploratory surgery and future reparative surgeries including bowel resection, ostomy formation, bladder repair, ureteral damage repair was discussed. Distended media absorption leading to fluid overload, metabolic complications such as coma and seizures was also discussed.  Due to the complex hyperplasia possibilities for future treatment pending pathology results as well as no guarantees that she will not have recurrent polyps in the future was also reviewed. The patient's and her husband's questions were answered and she is ready to proceed with surgery.   Note: This document was prepared with digital dictation and possible smart phrase technology. Any transcriptional errors that result from this process are unintentional.  Dara Lords MD, 8:25 AM 09/17/2013

## 2013-09-17 NOTE — Patient Instructions (Signed)
Followup for surgery as scheduled. 

## 2013-09-17 NOTE — Progress Notes (Signed)
Rachel Branch 15-Nov-1955 [id number]   Preoperative consult  Chief complaint: Postmenopausal bleeding, endometrial polyp  History of present illness: 57 y.o. G4P0013 with history of postmenopausal bleeding. She was amenorrheic since 2012 with onset of 3 weeks of vaginal bleeding November 2014, heavy at times with passage of clots. History of benign endometrial polypectomy 2012.  Sonohysterogram showed a normal-appearing uterus in size and echotexture. Endometrial echo 8.5 mm. Right and left ovaries were visualized and postmenopausal in appearance. Cul-de-sac was negative. 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 It was the pathologists impression that the complex hyperplasia involved the polyps. Patient is admitted for hysteroscopy D&C with removal of the endometrial polyps.  Past medical history,surgical history, medications, allergies, family history and social history were all reviewed and documented in the EPIC chart. ROS:  Was performed and pertinent positives and negatives are included in the history of present illness.  Exam:  [name] General: well developed, well nourished female, no acute distress HEENT: normal  Lungs: clear to auscultation without wheezing, rales or rhonchi  Cardiac: regular rate without rubs, murmurs or gallops  Abdomen: soft, nontender without masses, guarding, rebound, organomegaly  Pelvic: external bus vagina: normal   Cervix: grossly normal  Uterus: normal size, midline and mobile, nontender  Adnexa: without masses or tenderness      Assessment/Plan:  57 y.o. 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 I reviewed again with patient and her husband I recommendation to  proceed with hysteroscopy D&C, resection of endometrial polyps. I reviewed what is involved with the procedure to include the expected intraoperative and postoperative courses as well as the recovery period. Instrumentation to include the use of the resectoscope, hysteroscopy and D&C portion was reviewed. The risks to include infection, prolonged antibiotics, hemorrhage necessitating transfusion and the risks of transfusion including transfusion reaction, hepatitis, HIV, mad cow disease and other unknown entities. The risk of uterine perforation and damage to internal organs, either immediately recognized or delay recognized, including bowel, bladder, ureters, vessels and nerves necessitating major exploratory surgery and future reparative surgeries including bowel resection, ostomy formation, bladder repair, ureteral damage repair was discussed. Distended media absorption leading to fluid overload, metabolic complications such as coma and seizures was also discussed.  Due to the complex hyperplasia possibilities for future treatment pending pathology results as well as no guarantees that she will not have recurrent polyps in the future was also reviewed. The patient's and her husband's questions were answered and she is ready to proceed with surgery.   Note: This document was prepared with digital dictation and possible smart phrase technology. Any transcriptional errors that result from this process are unintentional.  Dara Lords MD, 8:12 AM 09/17/2013

## 2013-09-17 NOTE — Telephone Encounter (Signed)
I called patient yesterday because I had promised her I would keep checking for OR time to try to fit her in before the end of the year as she has met her $500 deductble and most of her co-insurance.  It starts over Jan 1.   I obtained an opening on Weds., Dec 31 and called her to see if she wanted that spot. She did. We scheduled her for that and preop consult for 12/30 at 8am.  Patient asked me regarding cough she has that is leftover from a virus she had a few weeks ago.  i did tell her that she would need to make nurse at [facility] aware as anesthesia will be the one to decide if her cough is an issue with anesthesia.  I did tell her she could mention it to Dr. Velvet Bathe at her consult visit.

## 2013-09-18 ENCOUNTER — Ambulatory Visit (HOSPITAL_COMMUNITY)
Admission: RE | Admit: 2013-09-18 | Discharge: 2013-09-18 | Disposition: A | Discharge status: Home / Self Care | Payer: BC Managed Care – PPO | Source: Ambulatory Visit | Attending: Gynecology | Admitting: Gynecology

## 2013-09-18 ENCOUNTER — Ambulatory Visit (HOSPITAL_COMMUNITY): Payer: BC Managed Care – PPO | Admitting: Anesthesiology

## 2013-09-18 ENCOUNTER — Encounter (HOSPITAL_COMMUNITY): Payer: BC Managed Care – PPO | Admitting: Anesthesiology

## 2013-09-18 ENCOUNTER — Encounter (HOSPITAL_COMMUNITY)
Admission: RE | Disposition: A | Discharge status: Home / Self Care | Payer: Self-pay | Source: Ambulatory Visit | Attending: Gynecology

## 2013-09-18 DIAGNOSIS — N840 Polyp of corpus uteri: Secondary | ICD-10-CM

## 2013-09-18 DIAGNOSIS — N950 Postmenopausal bleeding: Secondary | ICD-10-CM

## 2013-09-18 DIAGNOSIS — N8501 Benign endometrial hyperplasia: Secondary | ICD-10-CM | POA: Insufficient documentation

## 2013-09-18 HISTORY — PX: DILATATION & CURETTAGE/HYSTEROSCOPY WITH TRUECLEAR: SHX6353

## 2013-09-18 LAB — COMPREHENSIVE METABOLIC PANEL
AST: 54 U/L — ABNORMAL HIGH (ref 0–37)
BUN: 12 mg/dL (ref 6–23)
CO2: 25 mEq/L (ref 19–32)
Chloride: 102 mEq/L (ref 96–112)
Creatinine, Ser: 0.77 mg/dL (ref 0.50–1.10)
GFR calc non Af Amer: >90 mL/min (ref >90)
Glucose, Bld: 96 mg/dL (ref 70–99)
Total Bilirubin: 0.5 mg/dL (ref 0.3–1.2)

## 2013-09-18 LAB — CBC
HCT: 41.4 % (ref 36.0–46.0)
Hemoglobin: 14.2 g/dL (ref 12.0–15.0)
MCH: 30.3 pg (ref 26.0–34.0)
MCHC: 34.3 g/dL (ref 30.0–36.0)
MCV: 88.3 fL (ref 78.0–100.0)
Platelets: 253 10*3/uL (ref 150–400)
RBC: 4.69 MIL/uL (ref 3.87–5.11)
RDW: 13.7 % (ref 11.5–15.5)
WBC: 9.1 10*3/uL (ref 4.0–10.5)

## 2013-09-18 SURGERY — DILATATION & CURETTAGE/HYSTEROSCOPY WITH TRUCLEAR
Anesthesia: General

## 2013-09-18 MED ORDER — LIDOCAINE HCL (CARDIAC) 20 MG/ML IV SOLN
INTRAVENOUS | Status: AC
  Filled 2013-09-18: qty 5

## 2013-09-18 MED ORDER — KETOROLAC TROMETHAMINE 30 MG/ML IJ SOLN: 15.0000 mg | Freq: Once | INTRAMUSCULAR | Status: DC | PRN

## 2013-09-18 MED ORDER — MIDAZOLAM HCL 2 MG/2ML IJ SOLN
INTRAMUSCULAR | Status: DC | PRN
  Administered 2013-09-18: 2 mg via INTRAVENOUS

## 2013-09-18 MED ORDER — LIDOCAINE HCL (CARDIAC) 20 MG/ML IV SOLN
INTRAVENOUS | Status: DC | PRN
  Administered 2013-09-18: 80 mg via INTRAVENOUS

## 2013-09-18 MED ORDER — MIDAZOLAM HCL 2 MG/2ML IJ SOLN: 0.5000 mg | Freq: Once | INTRAMUSCULAR | Status: DC | PRN

## 2013-09-18 MED ORDER — LACTATED RINGERS IV SOLN
INTRAVENOUS | Status: DC
  Administered 2013-09-18 (×2): via INTRAVENOUS

## 2013-09-18 MED ORDER — FENTANYL CITRATE 0.05 MG/ML IJ SOLN
INTRAMUSCULAR | Status: DC | PRN
  Administered 2013-09-18: 100 ug via INTRAVENOUS

## 2013-09-18 MED ORDER — PROMETHAZINE HCL 25 MG/ML IJ SOLN: 6.2500 mg | INTRAMUSCULAR | Status: DC | PRN

## 2013-09-18 MED ORDER — LIDOCAINE HCL 1 % IJ SOLN
INTRAMUSCULAR | Status: AC
  Filled 2013-09-18: qty 20

## 2013-09-18 MED ORDER — KETOROLAC TROMETHAMINE 30 MG/ML IJ SOLN
INTRAMUSCULAR | Status: AC
  Filled 2013-09-18: qty 1

## 2013-09-18 MED ORDER — MEPERIDINE HCL 25 MG/ML IJ SOLN: 6.2500 mg | INTRAMUSCULAR | Status: DC | PRN

## 2013-09-18 MED ORDER — HYDROCODONE-ACETAMINOPHEN 5-325 MG PO TABS: 1.0000 | ORAL_TABLET | Freq: Four times a day (QID) | ORAL | Status: DC | PRN

## 2013-09-18 MED ORDER — ONDANSETRON HCL 4 MG/2ML IJ SOLN
INTRAMUSCULAR | Status: DC | PRN
  Administered 2013-09-18: 4 mg via INTRAVENOUS

## 2013-09-18 MED ORDER — FENTANYL CITRATE 0.05 MG/ML IJ SOLN
INTRAMUSCULAR | Status: AC
  Filled 2013-09-18: qty 2

## 2013-09-18 MED ORDER — MIDAZOLAM HCL 2 MG/2ML IJ SOLN
INTRAMUSCULAR | Status: AC
  Filled 2013-09-18: qty 2

## 2013-09-18 MED ORDER — KETOROLAC TROMETHAMINE 30 MG/ML IJ SOLN
INTRAMUSCULAR | Status: DC | PRN
  Administered 2013-09-18: 30 mg via INTRAVENOUS

## 2013-09-18 MED ORDER — ONDANSETRON HCL 4 MG/2ML IJ SOLN
INTRAMUSCULAR | Status: AC
  Filled 2013-09-18: qty 2

## 2013-09-18 MED ORDER — LIDOCAINE HCL 1 % IJ SOLN
INTRAMUSCULAR | Status: DC | PRN
  Administered 2013-09-18: 10 mL

## 2013-09-18 MED ORDER — DEXAMETHASONE SODIUM PHOSPHATE 10 MG/ML IJ SOLN
INTRAMUSCULAR | Status: AC
  Filled 2013-09-18: qty 1

## 2013-09-18 MED ORDER — PROPOFOL 10 MG/ML IV EMUL
INTRAVENOUS | Status: AC
  Filled 2013-09-18: qty 20

## 2013-09-18 MED ORDER — FENTANYL CITRATE 0.05 MG/ML IJ SOLN: 25.0000 ug | INTRAMUSCULAR | Status: DC | PRN

## 2013-09-18 MED ORDER — PROPOFOL 10 MG/ML IV BOLUS
INTRAVENOUS | Status: DC | PRN
  Administered 2013-09-18: 200 mg via INTRAVENOUS

## 2013-09-18 MED ORDER — DEXAMETHASONE SODIUM PHOSPHATE 10 MG/ML IJ SOLN
INTRAMUSCULAR | Status: DC | PRN
  Administered 2013-09-18: 10 mg via INTRAVENOUS

## 2013-09-18 SURGICAL SUPPLY — 20 items
BLADE INCISOR TRUC PLUS 2.9 (ABLATOR) IMPLANT
CANISTERS HI-FLOW 3000CC (CANNISTER) IMPLANT
CATH ROBINSON RED A/P 16FR (CATHETERS) ×2 IMPLANT
CLOTH BEACON ORANGE TIMEOUT ST (SAFETY) ×2 IMPLANT
CONTAINER PREFILL 10% NBF 60ML (FORM) ×4 IMPLANT
DRAPE HYSTEROSCOPY (DRAPE) ×2 IMPLANT
DRSG TELFA 3X8 NADH (GAUZE/BANDAGES/DRESSINGS) ×2 IMPLANT
GLOVE BIO SURGEON STRL SZ7.5 (GLOVE) ×2 IMPLANT
GOWN STRL REIN XL XLG (GOWN DISPOSABLE) ×4 IMPLANT
INCISOR TRUC PLUS BLADE 2.9 (ABLATOR) ×2
KIT HYSTEROSCOPY TRUCLEAR (ABLATOR) IMPLANT
MORCELLATOR RECIP TRUCLEAR 4.0 (ABLATOR) IMPLANT
NDL SPNL 22GX3.5 QUINCKE BK (NEEDLE) IMPLANT
NEEDLE SPNL 22GX3.5 QUINCKE BK (NEEDLE) IMPLANT
PACK VAGINAL MINOR WOMEN LF (CUSTOM PROCEDURE TRAY) ×2 IMPLANT
PAD DRESSING TELFA 3X8 NADH (GAUZE/BANDAGES/DRESSINGS) ×1 IMPLANT
PAD OB MATERNITY 4.3X12.25 (PERSONAL CARE ITEMS) ×2 IMPLANT
SYR CONTROL 10ML LL (SYRINGE) ×2 IMPLANT
TOWEL OR 17X24 6PK STRL BLUE (TOWEL DISPOSABLE) ×4 IMPLANT
WATER STERILE IRR 1000ML POUR (IV SOLUTION) ×2 IMPLANT

## 2013-09-18 NOTE — Anesthesia Preprocedure Evaluation (Addendum)
Anesthesia Evaluation  Patient identified by MRN, date of birth, ID band Patient awake    Reviewed: Allergy & Precautions, H&P , Patient's Chart, lab work & pertinent test results, reviewed documented beta blocker date and time   History of Anesthesia Complications Negative for: history of anesthetic complications  Airway Mallampati: III TM Distance: >3 FB Neck ROM: full  Mouth opening: Limited Mouth Opening  Dental   Pulmonary  breath sounds clear to auscultation        Cardiovascular Exercise Tolerance: Good Rhythm:regular Rate:Normal     Neuro/Psych    GI/Hepatic   Endo/Other  Hypothyroidism   Renal/GU      Musculoskeletal   Abdominal   Peds  Hematology  (+) anemia ,   Anesthesia Other Findings IBS Current itchy throat from allergies- no fevers or cough  Reproductive/Obstetrics                          Anesthesia Physical Anesthesia Plan  ASA: II  Anesthesia Plan: General LMA   Post-op Pain Management:    Induction:   Airway Management Planned:   Additional Equipment:   Intra-op Plan:   Post-operative Plan:   Informed Consent: I have reviewed the patients History and Physical, chart, labs and discussed the procedure including the risks, benefits and alternatives for the proposed anesthesia with the patient or authorized representative who has indicated his/her understanding and acceptance.   Dental Advisory Given  Plan Discussed with: CRNA and Surgeon  Anesthesia Plan Comments:        Anesthesia Quick Evaluation

## 2013-09-18 NOTE — Anesthesia Procedure Notes (Signed)
Procedure Name: LMA Insertion Date/Time: 09/18/2013 1:03 PM Performed by: Teresia Myint, Jannet Askew Pre-anesthesia Checklist: Patient identified, Patient being monitored, Emergency Drugs available, Timeout performed and Suction available Patient Re-evaluated:Patient Re-evaluated prior to inductionOxygen Delivery Method: Circle system utilized Preoxygenation: Pre-oxygenation with 100% oxygen Intubation Type: IV induction LMA: LMA inserted LMA Size: 4.0 Number of attempts: 1 Dental Injury: Teeth and Oropharynx as per pre-operative assessment

## 2013-09-18 NOTE — Op Note (Signed)
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 Cervix normal. Uterus normal size midline mobile. Adnexa without masses   Hysteroscopy:  Adequate noting fundus, anterior/posterior uterine surfaces, lower uterine segment, endocervical canal, right/left tubal ostia all visualized. 3 separate polyps noted one from the posterior lower uterine segment and 2 from the mid lateral endometrial surfaces. Endometrial surfaces otherwise atrophic in appearance. Pictures taken and stored in Epic media section  Procedure:  The patient was taken to the operating room, underwent general anesthesia, placed in the low dorsal lithotomy position, received a perineal and vaginal preparation with Betadine solution, in and out Foley catheterization performed and the patient was draped usual fashion by nursing personnel. The time out was performed by the surgical team. An examination under anesthesia was performed. The cervix visualized with a tenaculum, anterior lip of the cervix grasped with a single-tooth tenaculum and a paracervical block using 1% lidocaine, total of 10 cc was placed. The uterus was sounded and no dilatation was required. The Truclear 5 mm hysteroscope was placed with hysteroscopy findings noted above. Using the Truclear incisor all of the polyps were excised to the level of the surrounding endometrium. A sharp curettage was then performed and the specimen was sent to pathology separately. Repeat  hysteroscopy showed an empty cavity with good distention and no evidence of perforation. The instruments were removed and observation at the tenaculum site and external os showed adequate hemostasis. The patient received intraoperative Toradol, was awakened without difficulty and taken to the recovery room in good condition having tolerated the procedure well.   Note: This document was prepared with digital dictation and possible smart phrase technology. Any transcriptional errors that result from this process are unintentional.  Dara Lords MD, 2:05 PM 09/18/2013

## 2013-09-18 NOTE — Transfer of Care (Signed)
Immediate Anesthesia Transfer of Care Note  Patient: Rachel Branch  Procedure(s) Performed: Procedure(s): DILATATION & CURETTAGE/HYSTEROSCOPY WITH TRUCLEAR (N/A)  Patient Location: PACU  Anesthesia Type:General  Level of Consciousness: awake, alert  and oriented  Airway & Oxygen Therapy: Patient Spontanous Breathing and Patient connected to nasal cannula oxygen  Post-op Assessment: Report given to PACU RN and Post -op Vital signs reviewed and stable  Post vital signs: Reviewed and stable  Complications: No apparent anesthesia complications

## 2013-09-18 NOTE — Anesthesia Postprocedure Evaluation (Signed)
  Anesthesia Post-op Note  Anesthesia Post Note  Patient: Rachel Branch  Procedure(s) Performed: Procedure(s) (LRB): DILATATION & CURETTAGE/HYSTEROSCOPY WITH TRUCLEAR (N/A)  Anesthesia type: General  Patient location: PACU  Post pain: Pain level controlled  Post assessment: Post-op Vital signs reviewed  Last Vitals:  Filed Vitals:   09/18/13 1445  BP: 136/74  Pulse: 69  Temp: 36.7 C  Resp: 15    Post vital signs: Reviewed  Level of consciousness: sedated  Complications: No apparent anesthesia complications

## 2013-09-18 NOTE — H&P (Addendum)
  The patient was examined.  I reviewed the proposed surgery and consent form with the patient.  The dictated history and physical is current and accurate and all questions were answered. The patient is ready to proceed with surgery and has a realistic understanding and expectation for the outcome.  Addendum:  Preoperatively I reviewed with the patient and her husband the elevated alkaline phosphatase, AST and ALT. Patient states that these have been elevated previously through a check at her primary physician's office. I recommended that she followup with them for evaluation and followup and she agrees to arrange this.   Dara Lords MD, 12:35 PM 09/18/2013

## 2013-09-19 ENCOUNTER — Encounter (HOSPITAL_COMMUNITY): Payer: Self-pay | Admitting: Gynecology

## 2013-10-03 ENCOUNTER — Ambulatory Visit (INDEPENDENT_AMBULATORY_CARE_PROVIDER_SITE_OTHER): Payer: BC Managed Care – PPO | Admitting: Gynecology

## 2013-10-03 ENCOUNTER — Encounter: Payer: Self-pay | Admitting: Gynecology

## 2013-10-03 ENCOUNTER — Institutional Professional Consult (permissible substitution): Payer: BC Managed Care – PPO | Admitting: Gynecology

## 2013-10-03 DIAGNOSIS — N840 Polyp of corpus uteri: Secondary | ICD-10-CM

## 2013-10-03 DIAGNOSIS — N8501 Benign endometrial hyperplasia: Secondary | ICD-10-CM

## 2013-10-03 NOTE — Patient Instructions (Signed)
Follow up at the end of this year for your annual exam 

## 2013-10-03 NOTE — Progress Notes (Signed)
Patient presents for postoperative visit status post hysteroscopy D&C resection of endometrial polyps 09/18/2013. Has done well without significant pain or bleeding.  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 Cervix normal. Uterus grossly normal size midline mobile nontender. Adnexa without masses or tenderness.  Assessment and plan: Status post hysteroscopic resection endometrial polyps doing well. Reviewed pictures from the surgery as well as pathology. The pathology did show simple hyperplasia without atypia within the polyps. The endometrial curetting showed degenerating secret Tory endometrium but no evidence of hyperplasia. The issues as to whether we need to resample her in the future to make sure the hyperplasia does not recur versus observation and only workup if bleeding discussed. The hyperplasia was found in the polyp and not in the surrounding endometrium. I suspect this was treatment. We will readdress on an annual basis and decide if we want to resample her in the future.

## 2014-07-17 ENCOUNTER — Other Ambulatory Visit (HOSPITAL_COMMUNITY): Payer: Self-pay | Admitting: Pulmonary Disease

## 2014-07-17 DIAGNOSIS — E049 Nontoxic goiter, unspecified: Secondary | ICD-10-CM

## 2014-07-21 ENCOUNTER — Encounter: Payer: Self-pay | Admitting: Gynecology

## 2014-07-23 ENCOUNTER — Ambulatory Visit (HOSPITAL_COMMUNITY)
Admission: RE | Admit: 2014-07-23 | Discharge: 2014-07-23 | Disposition: A | Discharge status: Home / Self Care | Payer: BC Managed Care – PPO | Source: Ambulatory Visit | Attending: Pulmonary Disease | Admitting: Pulmonary Disease

## 2014-07-23 DIAGNOSIS — E049 Nontoxic goiter, unspecified: Secondary | ICD-10-CM | POA: Insufficient documentation

## 2014-08-26 ENCOUNTER — Other Ambulatory Visit: Payer: Self-pay | Admitting: Gynecology

## 2014-08-26 DIAGNOSIS — Z1231 Encounter for screening mammogram for malignant neoplasm of breast: Secondary | ICD-10-CM

## 2014-09-15 ENCOUNTER — Ambulatory Visit (INDEPENDENT_AMBULATORY_CARE_PROVIDER_SITE_OTHER): Payer: BC Managed Care – PPO | Admitting: Gynecology

## 2014-09-15 ENCOUNTER — Encounter: Payer: Self-pay | Admitting: Gynecology

## 2014-09-15 VITALS — BP 130/82 | Ht 66.5 in | Wt 240.0 lb

## 2014-09-15 DIAGNOSIS — Z01419 Encounter for gynecological examination (general) (routine) without abnormal findings: Secondary | ICD-10-CM

## 2014-09-15 DIAGNOSIS — N950 Postmenopausal bleeding: Secondary | ICD-10-CM

## 2014-09-15 NOTE — Progress Notes (Signed)
Rachel Branch 05/05/1956 [id number]        58 y.o.  G4P0013 for annual exam.  Several issues noted below.  Past medical history,surgical history, problem list, medications, allergies, family history and social history were all reviewed and documented as reviewed in the EPIC chart.  ROS:  Performed with pertinent positives and negatives included in the history, assessment and plan.   Additional significant findings :  none   Exam: Programmer, multimedia Vitals:   09/15/14 1031  BP: 130/82  Height: 5' 6.5" (1.689 m)  Weight: 240 lb (108.863 kg)   General appearance:  Normal affect, orientation and appearance. Skin: Grossly normal HEENT: Without gross lesions.  No cervical or supraclavicular adenopathy. Thyroid normal.  Lungs:  Clear without wheezing, rales or rhonchi Cardiac: RR, without RMG Abdominal:  Soft, nontender, without masses, guarding, rebound, organomegaly or hernia Breasts:  Examined lying and sitting without masses, retractions, discharge or axillary adenopathy. Pelvic:  Ext/BUS/vagina normal  Cervix normal  Uterus anteverted, normal size, shape and contour, midline and mobile nontender   Adnexa  Without masses or tenderness    Anus and perineum  Normal   Rectovaginal  Normal sphincter tone without palpated masses or tenderness.    Assessment/Plan:  58 y.o. G4P0013 female for annual exam.   1. Postmenopausal bleeding.  History of five-days of bleeding end of November.  History of endometrial polyp resection 08/2013 with simple hyperplasia without atypia within the polyp and a negative endometrial curetting.  Recommend sonohysterogram now and patient will schedule. 2. DEXA normal 2013. Plan repeat DEXA at age 64. Increased calcium vitamin D reviewed. 3. Pap smear 2014 normal. No history of significant abnormal Pap smears previously. Plan repeat Pap smear at 3 year interval per current screening guidelines. 4. Colonoscopy more than 10 years ago. Recommend patient call  and schedule now and she agrees to do so. [place] and numbers provided. 5. Mammography scheduled beginning of January. Continue with annual mammography. SBE monthly reviewed. 6. Health maintenance. No routine blood work done as she reports this done through her primary physician's office. Follow up for sonohysterogram     [name], 10:52 AM 09/15/2014

## 2014-09-15 NOTE — Patient Instructions (Signed)
Follow up for ultrasound as scheduled.  Schedule colonoscopy with [facility] at [phone] or [facility] gastroenterology at [phone]  You may obtain a copy of any labs that were done today by logging onto MyChart as outlined in the instructions provided with your AVS (after visit summary). The office will not call with normal lab results but certainly if there are any significant abnormalities then we will contact you.   Health Maintenance, Female A healthy lifestyle and preventative care can promote health and wellness.  Maintain regular health, dental, and eye exams.  Eat a healthy diet. Foods like vegetables, fruits, whole grains, low-fat dairy products, and lean protein foods contain the nutrients you need without too many calories. Decrease your intake of foods high in solid fats, added sugars, and salt. Get information about a proper diet from your caregiver, if necessary.  Regular physical exercise is one of the most important things you can do for your health. Most adults should get at least 150 minutes of moderate-intensity exercise (any activity that increases your heart rate and causes you to sweat) each week. In addition, most adults need muscle-strengthening exercises on 2 or more days a week.   Maintain a healthy weight. The body mass index (BMI) is a screening tool to identify possible weight problems. It provides an estimate of body fat based on height and weight. Your caregiver can help determine your BMI, and can help you achieve or maintain a healthy weight. For adults 20 years and older:  A BMI below 18.5 is considered underweight.  A BMI of 18.5 to 24.9 is normal.  A BMI of 25 to 29.9 is considered overweight.  A BMI of 30 and above is considered obese.  Maintain normal blood lipids and cholesterol by exercising and minimizing your intake of saturated fat. Eat a balanced diet with plenty of fruits and vegetables. Blood tests for lipids and cholesterol  should begin at age 46 and be repeated every 5 years. If your lipid or cholesterol levels are high, you are over 50, or you are a high risk for heart disease, you may need your cholesterol levels checked more frequently.Ongoing high lipid and cholesterol levels should be treated with medicines if diet and exercise are not effective.  If you smoke, find out from your caregiver how to quit. If you do not use tobacco, do not start.  Lung cancer screening is recommended for adults aged 68 80 years who are at high risk for developing lung cancer because of a history of smoking. Yearly low-dose computed tomography (CT) is recommended for people who have at least a 30-pack-year history of smoking and are a current smoker or have quit within the past 15 years. A pack year of smoking is smoking an average of 1 pack of cigarettes a day for 1 year (for example: 1 pack a day for 30 years or 2 packs a day for 15 years). Yearly screening should continue until the smoker has stopped smoking for at least 15 years. Yearly screening should also be stopped for people who develop a health problem that would prevent them from having lung cancer treatment.  If you are pregnant, do not drink alcohol. If you are breastfeeding, be very cautious about drinking alcohol. If you are not pregnant and choose to drink alcohol, do not exceed 1 drink per day. One drink is considered to be 12 ounces (355 mL) of beer, 5 ounces (148 mL) of wine, or 1.5 ounces (44 mL) of liquor.  Avoid use  of street drugs. Do not share needles with anyone. Ask for help if you need support or instructions about stopping the use of drugs.  High blood pressure causes heart disease and increases the risk of stroke. Blood pressure should be checked at least every 1 to 2 years. Ongoing high blood pressure should be treated with medicines, if weight loss and exercise are not effective.  If you are 58 to 57 years old, ask your caregiver if you should take aspirin  to prevent strokes.  Diabetes screening involves taking a blood sample to check your fasting blood sugar level. This should be done once every 3 years, after age 63, if you are within normal weight and without risk factors for diabetes. Testing should be considered at a younger age or be carried out more frequently if you are overweight and have at least 1 risk factor for diabetes.  Breast cancer screening is essential preventative care for women. You should practice "breast self-awareness." This means understanding the normal appearance and feel of your breasts and may include breast self-examination. Any changes detected, no matter how small, should be reported to a caregiver. Women in their 48s and 30s should have a clinical breast exam (CBE) by a caregiver as part of a regular health exam every 1 to 3 years. After age 64, women should have a CBE every year. Starting at age 37, women should consider having a mammogram (breast X-ray) every year. Women who have a family history of breast cancer should talk to their caregiver about genetic screening. Women at a high risk of breast cancer should talk to their caregiver about having an MRI and a mammogram every year.  Breast cancer gene (BRCA)-related cancer risk assessment is recommended for women who have family members with BRCA-related cancers. BRCA-related cancers include breast, ovarian, tubal, and peritoneal cancers. Having family members with these cancers may be associated with an increased risk for harmful changes (mutations) in the breast cancer genes BRCA1 and BRCA2. Results of the assessment will determine the need for genetic counseling and BRCA1 and BRCA2 testing.  The Pap test is a screening test for cervical cancer. Women should have a Pap test starting at age 36. Between ages 34 and 74, Pap tests should be repeated every 2 years. Beginning at age 68, you should have a Pap test every 3 years as long as the past 3 Pap tests have been normal. If  you had a hysterectomy for a problem that was not cancer or a condition that could lead to cancer, then you no longer need Pap tests. If you are between ages 36 and 40, and you have had normal Pap tests going back 10 years, you no longer need Pap tests. If you have had past treatment for cervical cancer or a condition that could lead to cancer, you need Pap tests and screening for cancer for at least 20 years after your treatment. If Pap tests have been discontinued, risk factors (such as a new sexual partner) need to be reassessed to determine if screening should be resumed. Some women have medical problems that increase the chance of getting cervical cancer. In these cases, your caregiver may recommend more frequent screening and Pap tests.  The human papillomavirus (HPV) test is an additional test that may be used for cervical cancer screening. The HPV test looks for the virus that can cause the cell changes on the cervix. The cells collected during the Pap test can be tested for HPV. The HPV test  could be used to screen women aged 13 years and older, and should be used in women of any age who have unclear Pap test results. After the age of 77, women should have HPV testing at the same frequency as a Pap test.  Colorectal cancer can be detected and often prevented. Most routine colorectal cancer screening begins at the age of 10 and continues through age 46. However, your caregiver may recommend screening at an earlier age if you have risk factors for colon cancer. On a yearly basis, your caregiver may provide home test kits to check for hidden blood in the stool. Use of a small camera at the end of a tube, to directly examine the colon (sigmoidoscopy or colonoscopy), can detect the earliest forms of colorectal cancer. Talk to your caregiver about this at age 16, when routine screening begins. Direct examination of the colon should be repeated every 5 to 10 years through age 61, unless early forms of  pre-cancerous polyps or small growths are found.  Hepatitis C blood testing is recommended for all people born from [birth year] through 1964 and any individual with known risks for hepatitis C.  Practice safe sex. Use condoms and avoid high-risk sexual practices to reduce the spread of sexually transmitted infections (STIs). Sexually active women aged 33 and younger should be checked for Chlamydia, which is a common sexually transmitted infection. Older women with new or multiple partners should also be tested for Chlamydia. Testing for other STIs is recommended if you are sexually active and at increased risk.  Osteoporosis is a disease in which the bones lose minerals and strength with aging. This can result in serious bone fractures. The risk of osteoporosis can be identified using a bone density scan. Women ages 69 and over and women at risk for fractures or osteoporosis should discuss screening with their caregivers. Ask your caregiver whether you should be taking a calcium supplement or vitamin D to reduce the rate of osteoporosis.  Menopause can be associated with physical symptoms and risks. Hormone replacement therapy is available to decrease symptoms and risks. You should talk to your caregiver about whether hormone replacement therapy is right for you.  Use sunscreen. Apply sunscreen liberally and repeatedly throughout the day. You should seek shade when your shadow is shorter than you. Protect yourself by wearing long sleeves, pants, a wide-brimmed hat, and sunglasses year round, whenever you are outdoors.  Notify your caregiver of new moles or changes in moles, especially if there is a change in shape or color. Also notify your caregiver if a mole is larger than the size of a pencil eraser.  Stay current with your immunizations. Document Released: 03/21/2011 Document Revised: 12/31/2012 Document Reviewed: 03/21/2011 [facility] Patient Information 2014 [place].

## 2014-09-15 NOTE — Addendum Note (Signed)
Addended by: Joaquin Music on: 09/15/2014 02:01 PM   Modules accepted: Orders

## 2014-09-22 ENCOUNTER — Ambulatory Visit (HOSPITAL_COMMUNITY)
Admission: RE | Admit: 2014-09-22 | Discharge: 2014-09-22 | Disposition: A | Discharge status: Home / Self Care | Payer: 59 | Source: Ambulatory Visit | Attending: Gynecology | Admitting: Gynecology

## 2014-09-22 DIAGNOSIS — Z1231 Encounter for screening mammogram for malignant neoplasm of breast: Secondary | ICD-10-CM | POA: Insufficient documentation

## 2014-09-24 ENCOUNTER — Other Ambulatory Visit: Payer: Self-pay | Admitting: Gynecology

## 2014-09-24 DIAGNOSIS — N950 Postmenopausal bleeding: Secondary | ICD-10-CM

## 2014-10-02 ENCOUNTER — Ambulatory Visit: Payer: BC Managed Care – PPO | Admitting: Gynecology

## 2014-10-02 ENCOUNTER — Other Ambulatory Visit: Payer: BC Managed Care – PPO

## 2014-10-22 ENCOUNTER — Encounter: Payer: BC Managed Care – PPO | Admitting: Gynecology

## 2014-10-29 ENCOUNTER — Ambulatory Visit (INDEPENDENT_AMBULATORY_CARE_PROVIDER_SITE_OTHER): Payer: 59 | Admitting: Gynecology

## 2014-10-29 ENCOUNTER — Ambulatory Visit (INDEPENDENT_AMBULATORY_CARE_PROVIDER_SITE_OTHER): Payer: 59

## 2014-10-29 ENCOUNTER — Other Ambulatory Visit: Payer: Self-pay | Admitting: Gynecology

## 2014-10-29 ENCOUNTER — Encounter: Payer: Self-pay | Admitting: Gynecology

## 2014-10-29 DIAGNOSIS — R9389 Abnormal findings on diagnostic imaging of other specified body structures: Secondary | ICD-10-CM

## 2014-10-29 DIAGNOSIS — N950 Postmenopausal bleeding: Secondary | ICD-10-CM

## 2014-10-29 DIAGNOSIS — N840 Polyp of corpus uteri: Secondary | ICD-10-CM

## 2014-10-29 DIAGNOSIS — R938 Abnormal findings on diagnostic imaging of other specified body structures: Secondary | ICD-10-CM

## 2014-10-29 NOTE — Patient Instructions (Signed)
Office will call you with biopsy results and to schedule the hysteroscopy D&C 

## 2014-10-29 NOTE — Progress Notes (Signed)
Rachel Branch 01/30/1956 [id number]        58 y.o.  [id number] patient presents for ultrasound due to history of postmenopausal bleeding.  Had 5 days of bleeding in November and then subsequent started bleeding again today. History of endometrial polyps with hysteroscopy D&C in 2007, 2012 and 2014. Polyp in 2014 showed simple hyperplasia without atypia. Surrounding endometrial curetting was negative.  Past medical history,surgical history, problem list, medications, allergies, family history and social history were all reviewed and documented in the EPIC chart.  Directed ROS with pertinent positives and negatives documented in the history of present illness/assessment and plan.  Exam: [place] assistant General appearance:  Normal External BUS vagina with light bleeding.  Cervix grossly normal.  Uterus normal size and echotexture. Slight arcuate pattern. Endometrial echo prominent at 11 mm. Right and left ovaries normal/atrophic.  Cul-de-sac negative.  Sonohysterogram performed, sterile technique, easy catheter introduction, good distention with endometrial polyp noted 23 x 9 x 12 mm. Endometrial sample taken. Patient tolerated well.  Assessment/Plan:  59 y.o. [id number] with recurrent endometrial polyps. Last episode showed simple hyperplasia without atypia. Options reviewed with the patient and her husband to include hysteroscopy D&C resection of endometrial polyp versus possible hysterectomy for recurrent polyps. The pros/cons, risks/benefits of each choice discussed. At this point they prefer a more conservative approach with hysteroscopy D&C. Will follow up for biopsy results and move towards scheduling the procedure.     [name], 2:58 PM 10/29/2014

## 2014-11-06 ENCOUNTER — Telehealth: Payer: Self-pay

## 2014-11-06 MED ORDER — MISOPROSTOL 200 MCG PO TABS: ORAL_TABLET | ORAL | Status: DC

## 2014-11-06 NOTE — Telephone Encounter (Signed)
I contacted patient and confirmed surgery date/time. Patient was instructed regarding the need to use Cytotec tab vaginally hs before surgery and that Rx was sent.  Pre op consult was scheduled as well.

## 2014-11-20 ENCOUNTER — Ambulatory Visit (INDEPENDENT_AMBULATORY_CARE_PROVIDER_SITE_OTHER): Payer: 59 | Admitting: Gynecology

## 2014-11-20 ENCOUNTER — Encounter: Payer: Self-pay | Admitting: Gynecology

## 2014-11-20 VITALS — BP 130/80

## 2014-11-20 DIAGNOSIS — N840 Polyp of corpus uteri: Secondary | ICD-10-CM

## 2014-11-20 DIAGNOSIS — N950 Postmenopausal bleeding: Secondary | ICD-10-CM

## 2014-11-20 NOTE — H&P (Signed)
  Rachel Branch 31-Oct-1955 [id number]   History and Physical  Chief complaint: postmenopausal bleeding, endometrial polyp  History of present illness: 59 y.o. G4P0013 with 5 days of vaginal bleeding in November 2015. Subsequent bleeding again February 2016.  sonohysterogram showed an endometrial polyp 23 x 9 x 12 mm.  Endometrial sample showed fragments of endometrial polyp with surrounding simple hyperplasia without atypia. Patient is admitted for hysteroscopy D&C. This is her fourth hysteroscopy with a prior polyp resection 2014 showing complex hyperplasia without atypia.  Past medical history,surgical history, medications, allergies, family history and social history were all reviewed and documented in the EPIC chart.  ROS:  Was performed and pertinent positives and negatives are included in the history of present illness.  Exam:  [name] 11/20/2014 General: well developed, well nourished female, no acute distress HEENT: normal  Lungs: clear to auscultation without wheezing, rales or rhonchi  Cardiac: regular rate without rubs, murmurs or gallops  Abdomen: soft, nontender without masses, guarding, rebound, organomegaly  Pelvic: external bus vagina: normal   Cervix: grossly normal  Uterus: normal size, midline and mobile, nontender  Adnexa: without masses or tenderness   Assessment/Plan:  59 y.o. G4W1027 with the above history. We discussed this being her fourth episode of endometrial polyps. Simple hyperplasia as well as complex demonstrated in the past without atypia. Issues as to whether we should proceed with hysterectomy now was discussed. We both agree with proceeding with hysteroscopy D&C now, obtaining the final pathology and then making a decision about hysterectomy. I reviewed the proposed surgery with her to include the expected intraoperative and postoperative courses as well as the recovery period. The risks of infection, prolonged antibiotics, hemorrhage necessitating  transfusion and the risks of transfusion reviewed to include transfusion reaction, hepatitis, HIV, mad cow disease and other unknown entities. The risks of uterine perforation and damage to internal organs including bowel, bladder, ureters, vessels, nerves either immediately recognized or delay recognized necessitating major exploratory reparative surgeries and future reparative surgeries including bowel resection, ostomy formation, bladder and ureteral damage repair was all reviewed with her. The risks of distended media absorption leading to metabolic complications such as fluid overload, coma and seizures was also discussed. Patient's questions were answered to her satisfaction and she is ready to proceed with surgery.    [name], 2:48 PM 11/20/2014

## 2014-11-20 NOTE — Progress Notes (Signed)
Rachel Branch 11/04/1955 [id number]   Preoperative consult  Chief complaint: postmenopausal bleeding, endometrial polyp  History of present illness: 59 y.o. G4P0013 with 5 days of vaginal bleeding in November 2015. Subsequent bleeding again February 2016.  sonohysterogram showed an endometrial polyp 23 x 9 x 12 mm.  Endometrial sample showed fragments of endometrial polyp with surrounding simple hyperplasia without atypia. Patient is admitted for hysteroscopy D&C. This is her fourth hysteroscopy with a prior polyp resection 2014 showing complex hyperplasia without atypia.  Past medical history,surgical history, medications, allergies, family history and social history were all reviewed and documented in the EPIC chart.  ROS:  Was performed and pertinent positives and negatives are included in the history of present illness.  Exam:  [name] General: well developed, well nourished female, no acute distress HEENT: normal  Lungs: clear to auscultation without wheezing, rales or rhonchi  Cardiac: regular rate without rubs, murmurs or gallops  Abdomen: soft, nontender without masses, guarding, rebound, organomegaly  Pelvic: external bus vagina: normal   Cervix: grossly normal  Uterus: normal size, midline and mobile, nontender  Adnexa: without masses or tenderness   Assessment/Plan:  59 y.o. [id number] with the above history. We discussed this being her fourth episode of endometrial polyps. Simple hyperplasia as well as complex demonstrated in the past without atypia. Issues as to whether we should proceed with hysterectomy now was discussed. We both agree with proceeding with hysteroscopy D&C now, obtaining the final pathology and then making a decision about hysterectomy. I reviewed the proposed surgery with her to include the expected intraoperative and postoperative courses as well as the recovery period. The risks of infection, prolonged antibiotics, hemorrhage necessitating transfusion and  the risks of transfusion reviewed to include transfusion reaction, hepatitis, HIV, mad cow disease and other unknown entities. The risks of uterine perforation and damage to internal organs including bowel, bladder, ureters, vessels, nerves either immediately recognized or delay recognized necessitating major exploratory reparative surgeries and future reparative surgeries including bowel resection, ostomy formation, bladder and ureteral damage repair was all reviewed with her. The risks of distended media absorption leading to metabolic complications such as fluid overload, coma and seizures was also discussed. Patient's questions were answered to her satisfaction and she is ready to proceed with surgery.   [name], 2:39 PM 11/20/2014

## 2014-11-20 NOTE — Patient Instructions (Signed)
Followup for surgery as scheduled. 

## 2014-12-01 MED ORDER — DEXTROSE 5 % IV SOLN
2.0000 g | INTRAVENOUS | Status: AC
  Administered 2014-12-02: 2 g via INTRAVENOUS
  Filled 2014-12-01: qty 2

## 2014-12-02 ENCOUNTER — Ambulatory Visit (HOSPITAL_COMMUNITY): Payer: 59 | Admitting: Anesthesiology

## 2014-12-02 ENCOUNTER — Encounter (HOSPITAL_COMMUNITY): Payer: Self-pay | Admitting: Anesthesiology

## 2014-12-02 ENCOUNTER — Ambulatory Visit (HOSPITAL_COMMUNITY)
Admission: RE | Admit: 2014-12-02 | Discharge: 2014-12-02 | Disposition: A | Discharge status: Home / Self Care | Payer: 59 | Source: Ambulatory Visit | Attending: Gynecology | Admitting: Gynecology

## 2014-12-02 ENCOUNTER — Encounter (HOSPITAL_COMMUNITY)
Admission: RE | Disposition: A | Discharge status: Home / Self Care | Payer: Self-pay | Source: Ambulatory Visit | Attending: Gynecology

## 2014-12-02 DIAGNOSIS — Z6838 Body mass index (BMI) 38.0-38.9, adult: Secondary | ICD-10-CM | POA: Insufficient documentation

## 2014-12-02 DIAGNOSIS — N950 Postmenopausal bleeding: Secondary | ICD-10-CM | POA: Diagnosis not present

## 2014-12-02 DIAGNOSIS — E039 Hypothyroidism, unspecified: Secondary | ICD-10-CM | POA: Insufficient documentation

## 2014-12-02 DIAGNOSIS — D649 Anemia, unspecified: Secondary | ICD-10-CM | POA: Diagnosis not present

## 2014-12-02 DIAGNOSIS — N840 Polyp of corpus uteri: Secondary | ICD-10-CM | POA: Diagnosis not present

## 2014-12-02 HISTORY — PX: DILATATION & CURETTAGE/HYSTEROSCOPY WITH MYOSURE: SHX6511

## 2014-12-02 LAB — COMPREHENSIVE METABOLIC PANEL
ALBUMIN: 3.9 g/dL (ref 3.5–5.2)
ALK PHOS: 149 U/L — AB (ref 39–117)
ALT: 76 U/L — AB (ref 0–35)
AST: 42 U/L — ABNORMAL HIGH (ref 0–37)
Anion gap: 8 (ref 5–15)
BILIRUBIN TOTAL: 0.5 mg/dL (ref 0.3–1.2)
BUN: 12 mg/dL (ref 6–23)
CALCIUM: 9 mg/dL (ref 8.4–10.5)
CO2: 30 mmol/L (ref 19–32)
Chloride: 102 mmol/L (ref 96–112)
Creatinine, Ser: 0.84 mg/dL (ref 0.50–1.10)
GFR calc Af Amer: 87 mL/min — ABNORMAL LOW (ref >90)
GFR calc non Af Amer: 75 mL/min — ABNORMAL LOW (ref >90)
GLUCOSE: 119 mg/dL — AB (ref 70–99)
Potassium: 4.4 mmol/L (ref 3.5–5.1)
Sodium: 140 mmol/L (ref 135–145)
Total Protein: 7.6 g/dL (ref 6.0–8.3)

## 2014-12-02 LAB — CBC
HEMATOCRIT: 40.7 % (ref 36.0–46.0)
HEMOGLOBIN: 13.5 g/dL (ref 12.0–15.0)
MCH: 29.3 pg (ref 26.0–34.0)
MCHC: 33.2 g/dL (ref 30.0–36.0)
MCV: 88.5 fL (ref 78.0–100.0)
PLATELETS: 287 10*3/uL (ref 150–400)
RBC: 4.6 MIL/uL (ref 3.87–5.11)
RDW: 14.1 % (ref 11.5–15.5)
WBC: 9.6 10*3/uL (ref 4.0–10.5)

## 2014-12-02 SURGERY — DILATATION & CURETTAGE/HYSTEROSCOPY WITH MYOSURE
Anesthesia: General

## 2014-12-02 MED ORDER — METOCLOPRAMIDE HCL 5 MG/ML IJ SOLN: 10.0000 mg | Freq: Once | INTRAMUSCULAR | Status: DC | PRN

## 2014-12-02 MED ORDER — FENTANYL CITRATE 0.05 MG/ML IJ SOLN: 25.0000 ug | INTRAMUSCULAR | Status: DC | PRN

## 2014-12-02 MED ORDER — MIDAZOLAM HCL 2 MG/2ML IJ SOLN
INTRAMUSCULAR | Status: AC
  Filled 2014-12-02: qty 2

## 2014-12-02 MED ORDER — LACTATED RINGERS IV SOLN
INTRAVENOUS | Status: DC
  Administered 2014-12-02: 10:00:00 via INTRAVENOUS

## 2014-12-02 MED ORDER — DEXAMETHASONE SODIUM PHOSPHATE 4 MG/ML IJ SOLN
INTRAMUSCULAR | Status: AC
  Filled 2014-12-02: qty 1

## 2014-12-02 MED ORDER — DEXAMETHASONE SODIUM PHOSPHATE 10 MG/ML IJ SOLN
INTRAMUSCULAR | Status: DC | PRN
  Administered 2014-12-02: 4 mg via INTRAVENOUS

## 2014-12-02 MED ORDER — OXYCODONE-ACETAMINOPHEN 5-325 MG PO TABS: 1.0000 | ORAL_TABLET | ORAL | Status: DC | PRN

## 2014-12-02 MED ORDER — SCOPOLAMINE 1 MG/3DAYS TD PT72
1.0000 | MEDICATED_PATCH | Freq: Once | TRANSDERMAL | Status: DC
  Administered 2014-12-02: 1.5 mg via TRANSDERMAL

## 2014-12-02 MED ORDER — SODIUM CHLORIDE 0.9 % IR SOLN
Status: DC | PRN
  Administered 2014-12-02: 3000 mL

## 2014-12-02 MED ORDER — FENTANYL CITRATE 0.05 MG/ML IJ SOLN
INTRAMUSCULAR | Status: AC
  Filled 2014-12-02: qty 5

## 2014-12-02 MED ORDER — MEPERIDINE HCL 25 MG/ML IJ SOLN: 6.2500 mg | INTRAMUSCULAR | Status: DC | PRN

## 2014-12-02 MED ORDER — LIDOCAINE HCL 1 % IJ SOLN
INTRAMUSCULAR | Status: AC
  Filled 2014-12-02: qty 20

## 2014-12-02 MED ORDER — SCOPOLAMINE 1 MG/3DAYS TD PT72
MEDICATED_PATCH | TRANSDERMAL | Status: AC
  Administered 2014-12-02: 1.5 mg via TRANSDERMAL
  Filled 2014-12-02: qty 1

## 2014-12-02 MED ORDER — LIDOCAINE HCL (CARDIAC) 20 MG/ML IV SOLN
INTRAVENOUS | Status: DC | PRN
  Administered 2014-12-02: 80 mg via INTRAVENOUS

## 2014-12-02 MED ORDER — ONDANSETRON HCL 4 MG/2ML IJ SOLN
INTRAMUSCULAR | Status: DC | PRN
  Administered 2014-12-02: 4 mg via INTRAVENOUS

## 2014-12-02 MED ORDER — KETOROLAC TROMETHAMINE 30 MG/ML IJ SOLN
INTRAMUSCULAR | Status: AC
  Filled 2014-12-02: qty 1

## 2014-12-02 MED ORDER — ONDANSETRON HCL 4 MG/2ML IJ SOLN
INTRAMUSCULAR | Status: AC
  Filled 2014-12-02: qty 2

## 2014-12-02 MED ORDER — PROPOFOL 10 MG/ML IV BOLUS
INTRAVENOUS | Status: DC | PRN
  Administered 2014-12-02: 180 mg via INTRAVENOUS

## 2014-12-02 MED ORDER — PROPOFOL 10 MG/ML IV BOLUS
INTRAVENOUS | Status: AC
  Filled 2014-12-02: qty 20

## 2014-12-02 MED ORDER — LIDOCAINE HCL (CARDIAC) 20 MG/ML IV SOLN
INTRAVENOUS | Status: AC
  Filled 2014-12-02: qty 5

## 2014-12-02 MED ORDER — KETOROLAC TROMETHAMINE 30 MG/ML IJ SOLN
INTRAMUSCULAR | Status: DC | PRN
  Administered 2014-12-02: 30 mg via INTRAVENOUS

## 2014-12-02 MED ORDER — FENTANYL CITRATE 0.05 MG/ML IJ SOLN
INTRAMUSCULAR | Status: DC | PRN
  Administered 2014-12-02 (×2): 50 ug via INTRAVENOUS

## 2014-12-02 MED ORDER — LIDOCAINE HCL 1 % IJ SOLN
INTRAMUSCULAR | Status: DC | PRN
  Administered 2014-12-02: 10 mL

## 2014-12-02 MED ORDER — MIDAZOLAM HCL 2 MG/2ML IJ SOLN
INTRAMUSCULAR | Status: DC | PRN
  Administered 2014-12-02: 1.5 mg via INTRAVENOUS

## 2014-12-02 SURGICAL SUPPLY — 15 items
CATH ROBINSON RED A/P 16FR (CATHETERS) ×3 IMPLANT
CLOTH BEACON ORANGE TIMEOUT ST (SAFETY) ×3 IMPLANT
CONTAINER PREFILL 10% NBF 60ML (FORM) ×6 IMPLANT
DEVICE MYOSURE CLASSIC (MISCELLANEOUS) IMPLANT
DEVICE MYOSURE LITE (MISCELLANEOUS) ×2 IMPLANT
FILTER ARTHROSCOPY CONVERTOR (FILTER) ×3 IMPLANT
GLOVE BIO SURGEON STRL SZ7 (GLOVE) ×6 IMPLANT
GOWN STRL REUS W/TWL LRG LVL3 (GOWN DISPOSABLE) ×6 IMPLANT
PACK VAGINAL MINOR WOMEN LF (CUSTOM PROCEDURE TRAY) ×3 IMPLANT
PAD OB MATERNITY 4.3X12.25 (PERSONAL CARE ITEMS) ×3 IMPLANT
SEAL ROD LENS SCOPE MYOSURE (ABLATOR) ×3 IMPLANT
TOWEL OR 17X24 6PK STRL BLUE (TOWEL DISPOSABLE) ×6 IMPLANT
TUBING AQUILEX INFLOW (TUBING) ×3 IMPLANT
TUBING AQUILEX OUTFLOW (TUBING) ×3 IMPLANT
WATER STERILE IRR 1000ML POUR (IV SOLUTION) ×3 IMPLANT

## 2014-12-02 NOTE — Discharge Instructions (Signed)
° °  Postoperative Instructions Hysteroscopy D & C ° °Dr. Keenon Leitzel and the nursing staff have discussed postoperative instructions with you.  If you have any questions please ask them before you leave the hospital, or call Dr Patrick Salemi’s office at [phone].   ° °We would like to emphasize the following instructions: ° ° °? Call the office to make your follow-up appointment as recommended by Dr Pamelyn Bancroft (usually 1-2 weeks). ° °? You were given a prescription, or one was ordered for you at the pharmacy you designated.  Get that prescription filled and take the medication according to instructions. ° °? You may eat a regular diet, but slowly until you start having bowel movements. ° °? Drink plenty of water daily. ° °? Nothing in the vagina (intercourse, douching, objects of any kind) for two weeks.  When reinitiating intercourse, if it is uncomfortable, stop and make an appointment with Dr Akshita Italiano to be evaluated. ° °? No driving for one to two days until the effects of anesthesia has worn off.  No traveling out of town for several days. ° °? You may shower, but no baths for one week.  Walking up and down stairs is ok.  No heavy lifting, prolonged standing, repeated bending or any “working out” until your post op check. ° °? Rest frequently, listen to your body and do not push yourself and overdo it. ° °? Call if: ° °o Your pain medication does not seem strong enough. °o Worsening pain or abdominal bloating °o Persistent nausea or vomiting °o Difficulty with urination or bowel movements. °o Temperature of 101 degrees or higher. °o Heavy vaginal bleeding.  If your period is due, you may use tampons. °o You have any questions or concerns ° ° ° °

## 2014-12-02 NOTE — Anesthesia Preprocedure Evaluation (Addendum)
Anesthesia Evaluation  Patient identified by MRN, date of birth, ID band Patient awake    Reviewed: Allergy & Precautions, NPO status , Patient's Chart, lab work & pertinent test results  Airway Mallampati: IV  TM Distance: >3 FB Neck ROM: Full  Mouth opening: Limited Mouth Opening  Dental no notable dental hx. (+) Teeth Intact   Pulmonary  Snores- ?undiagnosed OSA breath sounds clear to auscultation  Pulmonary exam normal       Cardiovascular negative cardio ROS  Rhythm:Regular Rate:Normal     Neuro/Psych negative neurological ROS  negative psych ROS   GI/Hepatic Neg liver ROS, Pernicious anemia   Endo/Other  negative endocrine ROSHypothyroidism Morbid obesity  Renal/GU   negative genitourinary   Musculoskeletal negative musculoskeletal ROS (+)   Abdominal   Peds  Hematology  (+) anemia ,   Anesthesia Other Findings   Reproductive/Obstetrics Endometrial polyp PMB                            Anesthesia Physical Anesthesia Plan  ASA: II  Anesthesia Plan: General   Post-op Pain Management:    Induction: Intravenous  Airway Management Planned: LMA  Additional Equipment:   Intra-op Plan:   Post-operative Plan: Extubation in OR  Informed Consent: I have reviewed the patients History and Physical, chart, labs and discussed the procedure including the risks, benefits and alternatives for the proposed anesthesia with the patient or authorized representative who has indicated his/her understanding and acceptance.   Dental advisory given  Plan Discussed with: CRNA, Anesthesiologist and Surgeon  Anesthesia Plan Comments:        Anesthesia Quick Evaluation

## 2014-12-02 NOTE — Transfer of Care (Signed)
Immediate Anesthesia Transfer of Care Note  Patient: Rachel Branch  Procedure(s) Performed: Procedure(s): DILATATION & CURETTAGE/HYSTEROSCOPY WITH MYOSURE (N/A)  Patient Location: PACU  Anesthesia Type:General  Level of Consciousness: awake and oriented  Airway & Oxygen Therapy: Patient Spontanous Breathing and Patient connected to nasal cannula oxygen  Post-op Assessment: Report given to RN and Post -op Vital signs reviewed and stable  Post vital signs: Reviewed and stable  Last Vitals:  Filed Vitals:   12/02/14 0904  BP: 136/85  Pulse: 70  Temp: 36.7 C  Resp: 16    Complications: No apparent anesthesia complications

## 2014-12-02 NOTE — Op Note (Signed)
Rachel Branch 1955-12-05 [id number]   Post Operative Note   Date of surgery:  12/02/2014  Pre Op Dx:  Postmenopausal bleeding, endometrial polyp  Post Op Dx:  Postmenopausal bleeding, endometrial polyp  Procedure:  Hysteroscopy D&C. Myosure endometrial polyp resection  Surgeon:  Donalynn Furlong P  Anesthesia:  General  EBL:  minimal  Distended media discrepancy:  244 cc saline  Complications:  None  Specimen:  #1 endometrial curetting #2 endometrial polyp to pathology  Findings: EUA:  External BUS vagina normal. Cervix normal. Uterus normal size midline mobile. Adnexa without masses   Hysteroscopy:  Adequate noting right and left tubal ostia, fundus, anterior and posterior endometrial surfaces, lower uterine segment, endocervical canal all visualized. Endometrial polyp right lateral endometrial surface. Fingerlike endometrial polyp lower uterine segment/upper cervical canal. Both polyps resected to the level of the surrounding tissue.  Procedure:  Patient was taken to the operating room, underwent general anesthesia, was placed in the low dorsal lithotomy position, received a vaginal/perineal preparation with Betadine solution, the bladder emptied with an in and out Foley catheterization by nursing personnel and an EUA was performed. The timeout was performed by the surgical team. The patient was draped in the usual fashion. The cervix was visualized with a speculum, anterior lip grasped a single tooth tenaculum and a paracervical block was placed using 1% lidocaine, 10 cc total. The cervix was gently dilated to admit the 6 mm Myosure hysteroscope and hysteroscopy was performed with findings noted above. Using the Myosure Lite resectoscopic wand, both polyps were resected to the level of the surrounding endometrium. A gentle sharp curettage was then performed and the specimens were sent separately to pathology. Repeat hysteroscopy showed an empty cavity with good distention and no evidence  of perforation. The instruments were removed and adequate hemostasis was visualized at the tenaculum site and external cervical os. The patient received intraoperative Toradol, was awakened without difficulty and taken to recovery room in good condition having tolerated procedure well.     Rachel Auerbach MD, 11:11 AM 12/02/2014

## 2014-12-02 NOTE — Anesthesia Postprocedure Evaluation (Signed)
  Anesthesia Post-op Note  Patient: Rachel Branch  Procedure(s) Performed: Procedure(s): DILATATION & CURETTAGE/HYSTEROSCOPY WITH MYOSURE (N/A)  Patient Location: PACU  Anesthesia Type:General  Level of Consciousness: awake, alert  and oriented  Airway and Oxygen Therapy: Patient Spontanous Breathing  Post-op Pain: none  Post-op Assessment: Post-op Vital signs reviewed, Patient's Cardiovascular Status Stable, Respiratory Function Stable, Patent Airway, No signs of Nausea or vomiting and Pain level controlled  Post-op Vital Signs: Reviewed and stable  Last Vitals:  Filed Vitals:   12/02/14 1215  BP: 120/69  Pulse: 63  Temp: 36.6 C  Resp: 14    Complications: No apparent anesthesia complications

## 2014-12-02 NOTE — H&P (Signed)
  The patient was examined.  I reviewed the proposed surgery and consent form with the patient.  The dictated history and physical is current and accurate and all questions were answered. The patient is ready to proceed with surgery and has a realistic understanding and expectation for the outcome.  LFT's/alk phos mildly elevated as in the past.  Patient reports her primary is following this.   [name], 10:10 AM 12/02/2014

## 2014-12-03 ENCOUNTER — Encounter (HOSPITAL_COMMUNITY): Payer: Self-pay | Admitting: Gynecology

## 2014-12-17 ENCOUNTER — Encounter: Payer: Self-pay | Admitting: Gynecology

## 2014-12-17 ENCOUNTER — Ambulatory Visit (INDEPENDENT_AMBULATORY_CARE_PROVIDER_SITE_OTHER): Payer: 59 | Admitting: Gynecology

## 2014-12-17 VITALS — BP 128/84

## 2014-12-17 DIAGNOSIS — Z9889 Other specified postprocedural states: Secondary | ICD-10-CM

## 2014-12-17 NOTE — Progress Notes (Signed)
Rachel Branch 08-09-1956 [id number]        59 y.o.  [id number] Presents for her postoperative visit status post hysteroscopic resection of endometrial polyps, D&C. Doing well without complaints and no bleeding. Pathology: 1. Endometrium, curettage - FRAGMENTS OF ENDOMETRIAL POLYP; NEGATIVE FOR ATYPIA OR MALIGNANCY. - SEPARATE FRAGMENTS OF BENIGN ENDOMETRIAL GLANDS AND STROMA WITH FEATURES OF BREAKDOWN; NEGATIVE FOR HYPERPLASIA OR MALIGNANCY. - BENIGN ENDOCERVICAL MUCOSA. - DETACHED FRAGMENTS OF SQUAMOUS EPITHELIUM; NEGATIVE FOR INTRAEPITHELIAL LESION OR MALIGNANCY. 2. Endometrial polyp - FRAGMENTS OF ENDOMETRIAL POLYP. - NEGATIVE FOR ATYPIA OR MALIGNANCY.  Past medical history,surgical history, problem list, medications, allergies, family history and social history were all reviewed and documented in the EPIC chart.  Directed ROS with pertinent positives and negatives documented in the history of present illness/assessment and plan.  Exam: [name] Filed Vitals:   12/17/14 0926  BP: 128/84   General appearance:  Normal Abdomen soft nontender without masses guarding rebound Pelvic external BUS vagina normal. Cervix normal. Uterus grossly normal midline mobile nontender. Adnexa without masses or tenderness.  Assessment/Plan:  59 y.o. [id number] with normal postoperative visit status post hysteroscopic resection of endometrial polyps and D&C. Reviewed benign pathology with her and pictures from the surgery. We'll keep bleeding calendar as long as no further bleeding will monitor. If any further bleeding she'll represent for further evaluation. Otherwise she will follow up when she is due for her annual exam.    [name], 9:43 AM 12/17/2014

## 2014-12-17 NOTE — Patient Instructions (Signed)
Follow up when you're due for your annual exam, sooner if any bleeding or other issues.

## 2015-01-02 IMAGING — NM NM HEPATO W/GB/PHARM/[PERSON_NAME]
2 series · 12 of 12 positions shown · non-contrast
Comparison: none

CLINICAL DATA: Abdominal pain

NUCLEAR MEDICINE HEPATOBILIARY WITH GB, PHARM AND QUAN
MEASURE/ejection fraction analysis
Views:  Anterior right upper quadrant
Radiopharmaceutical: Technetium 99m mebrofenin
Dose:  5.0 mCi
Route of administration:  Intravenous

[gb hepatobiliary scan · 3.19mm/px · 6 of 60 frames shown (1 of 2)]
[frame 6/60]
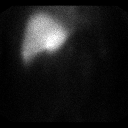
[frame 16/60]
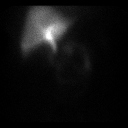
[frame 26/60]
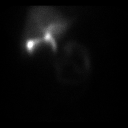
[frame 36/60]
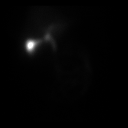
[frame 46/60]
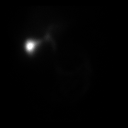
[frame 56/60]
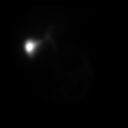

[gb hepatobiliary scan · 3.19mm/px · 6 of 30 frames shown (2 of 2)]
[frame 3/30]
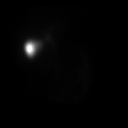
[frame 8/30]
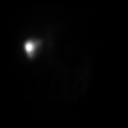
[frame 13/30]
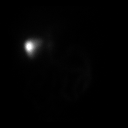
[frame 18/30]
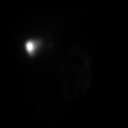
[frame 23/30]
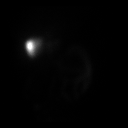
[frame 28/30]
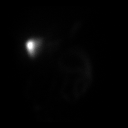

[12 of 12 positions shown; findings below may reference images not displayed]

FINDINGS: Liver uptake of radiotracer is normal.  There is prompt
visualization of gallbladder and small bowel, indicating patency of
the cystic and common bile ducts.

A weight-based dose, 2.05 mcg, of CCK was administered
intravenously with calculation of the computer-generated ejection
fraction of radiotracer from the gallbladder.  The ejection
fraction of radiotracer the gallbladder is abnormally low at 15.8%,
normal greater than 38%.
IMPRESSION: Abnormally low ejection fraction of radiotracer from the
gallbladder, a finding felt to be consistent with biliary
dyskinesia.  Cystic and common bile ducts are patent as is
evidenced by visualization of gallbladder and small bowel.

## 2015-06-11 ENCOUNTER — Other Ambulatory Visit: Payer: Self-pay | Admitting: Gynecology

## 2015-06-11 ENCOUNTER — Ambulatory Visit (INDEPENDENT_AMBULATORY_CARE_PROVIDER_SITE_OTHER): Payer: 59 | Admitting: Gynecology

## 2015-06-11 ENCOUNTER — Encounter: Payer: Self-pay | Admitting: Gynecology

## 2015-06-11 ENCOUNTER — Telehealth: Payer: Self-pay

## 2015-06-11 VITALS — BP 130/80

## 2015-06-11 DIAGNOSIS — R300 Dysuria: Secondary | ICD-10-CM | POA: Diagnosis not present

## 2015-06-11 DIAGNOSIS — N926 Irregular menstruation, unspecified: Secondary | ICD-10-CM

## 2015-06-11 LAB — URINALYSIS W MICROSCOPIC + REFLEX CULTURE
Bilirubin Urine: NEGATIVE
CRYSTALS: NONE SEEN [HPF]
Casts: NONE SEEN [LPF]
GLUCOSE, UA: NEGATIVE
KETONES UR: NEGATIVE
LEUKOCYTES UA: NEGATIVE
Nitrite: NEGATIVE
PH: 6 (ref 5.0–8.0)
Protein, ur: NEGATIVE
Specific Gravity, Urine: 1.015 (ref 1.001–1.035)
YEAST: NONE SEEN [HPF]

## 2015-06-11 LAB — CBC WITH DIFFERENTIAL/PLATELET
BASOS ABS: 0 10*3/uL (ref 0.0–0.1)
Basophils Relative: 0 % (ref 0–1)
EOS ABS: 0.3 10*3/uL (ref 0.0–0.7)
EOS PCT: 3 % (ref 0–5)
HCT: 40.9 % (ref 36.0–46.0)
Hemoglobin: 13.6 g/dL (ref 12.0–15.0)
LYMPHS ABS: 2.6 10*3/uL (ref 0.7–4.0)
Lymphocytes Relative: 27 % (ref 12–46)
MCH: 29.1 pg (ref 26.0–34.0)
MCHC: 33.3 g/dL (ref 30.0–36.0)
MCV: 87.6 fL (ref 78.0–100.0)
MONO ABS: 1 10*3/uL (ref 0.1–1.0)
MPV: 9.8 fL (ref 8.6–12.4)
Monocytes Relative: 11 % (ref 3–12)
Neutro Abs: 5.6 10*3/uL (ref 1.7–7.7)
Neutrophils Relative %: 59 % (ref 43–77)
PLATELETS: 322 10*3/uL (ref 150–400)
RBC: 4.67 MIL/uL (ref 3.87–5.11)
RDW: 14.3 % (ref 11.5–15.5)
WBC: 9.5 10*3/uL (ref 4.0–10.5)

## 2015-06-11 MED ORDER — IBUPROFEN 800 MG PO TABS: 800.0000 mg | ORAL_TABLET | Freq: Three times a day (TID) | ORAL | Status: DC | PRN

## 2015-06-11 MED ORDER — MEGESTROL ACETATE 20 MG PO TABS: 20.0000 mg | ORAL_TABLET | Freq: Two times a day (BID) | ORAL | Status: DC

## 2015-06-11 MED ORDER — PHENAZOPYRIDINE HCL 200 MG PO TABS: ORAL_TABLET | ORAL | Status: DC

## 2015-06-11 NOTE — Patient Instructions (Signed)
Office will call you to arrange surgery. 

## 2015-06-11 NOTE — Addendum Note (Signed)
Addended by: Nelva Nay on: 06/11/2015 02:13 PM   Modules accepted: Orders

## 2015-06-11 NOTE — Telephone Encounter (Signed)
Patient was scheduled for LAVH,BSO for 08/07/15 and pre op cons 07/22/15 at 11:00am.  She was advised she will need to take Pyridium tablet night before and morning of surgery. I advised her that morning of surgery she should take it only with a tiny sip of water. Rx sent.

## 2015-06-11 NOTE — Progress Notes (Signed)
Rachel Branch 03-May-1956 [id number]        59 y.o.  [id number] Presents complaining on persistent on and off bleeding since hysteroscopy D&C March 2016. Most recently had several days of heavy bleeding with passage of clots. Having a lot of low back pain associated with this. Now bleeding has resolved.  History of for D&Cs in the past noting 2014 with simple hyperplasia without atypia. 2016 showing an endometrial polyp.  Past medical history,surgical history, problem list, medications, allergies, family history and social history were all reviewed and documented in the EPIC chart.  Directed ROS with pertinent positives and negatives documented in the history of present illness/assessment and plan.  Exam: [name] Filed Vitals:   06/11/15 1223  BP: 130/80   General appearance:  Normal Spine straight without CVA tenderness Abdomen soft nontender without masses guarding rebound Pelvic external BUS vagina normal without bleeding. Cervix grossly normal. Uterus normal size midline mobile and nontender. Adnexa without masses or tenderness.  Assessment/Plan:  59 y.o. Rachel Branch with current postmenopausal bleeding now going on for 7-8 months. Options for management to include hormonal manipulation, endometrial ablation, hysterectomy discussed. Patient is very tired of the repetitive hysteroscopy D&Cs and persistent bleeding. She is ready to proceed with definitive treatment of hysterectomy. I certainly think this is reasonable given her picture. Will plan on LAVH BSO. I discussed the whole issue of ovarian conservation in the issues of maintaining her ovaries for continued hormone production recognizing her age of 38 accepting the risk of ovarian disease to include cancer in the future versus removing her ovaries and considering ERT if she is symptomatic. The issues of ERT to include the WHI study and increased risk of stroke heart attack DVT and breast cancer all reviewed. Patient agrees with the plan and  when schedule her and move towards surgery. Patient will follow up for a full preoperative consult. I gave her a prescription for Megace 20 mg to start twice daily if she starts to bleed again and use intermittently to suppress leading. Will check baseline CBC TSH today. I also gave her a prescription for Motrin 800 mg #30 one refill to take every 8 hours as needed for her back pain and pelvic discomfort. If this would continue she's going to call and follow up with ultrasound just to rule out nonpalpable abnormalities.    Rachel Auerbach MD, 12:51 PM 06/11/2015

## 2015-06-12 ENCOUNTER — Other Ambulatory Visit: Payer: Self-pay | Admitting: *Deleted

## 2015-06-12 LAB — URINE CULTURE
Colony Count: NO GROWTH
ORGANISM ID, BACTERIA: NO GROWTH

## 2015-06-12 LAB — TSH: TSH: 0.236 u[IU]/mL — ABNORMAL LOW (ref 0.350–4.500)

## 2015-06-12 MED ORDER — LEVOTHYROXINE SODIUM 112 MCG PO TABS: 112.0000 ug | ORAL_TABLET | Freq: Every day | ORAL | Status: DC

## 2015-06-15 ENCOUNTER — Other Ambulatory Visit: Payer: Self-pay | Admitting: Gynecology

## 2015-06-15 DIAGNOSIS — R7989 Other specified abnormal findings of blood chemistry: Secondary | ICD-10-CM

## 2015-06-15 DIAGNOSIS — R3129 Other microscopic hematuria: Secondary | ICD-10-CM

## 2015-06-16 ENCOUNTER — Other Ambulatory Visit: Payer: 59

## 2015-06-16 DIAGNOSIS — R3129 Other microscopic hematuria: Secondary | ICD-10-CM

## 2015-06-17 ENCOUNTER — Encounter: Payer: Self-pay | Admitting: Gynecology

## 2015-06-17 LAB — URINALYSIS W MICROSCOPIC + REFLEX CULTURE
Bilirubin Urine: NEGATIVE
Casts: NONE SEEN [LPF]
Crystals: NONE SEEN [HPF]
Glucose, UA: NEGATIVE
Ketones, ur: NEGATIVE
LEUKOCYTES UA: NEGATIVE
NITRITE: NEGATIVE
PH: 7 (ref 5.0–8.0)
Protein, ur: NEGATIVE
SPECIFIC GRAVITY, URINE: 1.005 (ref 1.001–1.035)
Squamous Epithelial / LPF: NONE SEEN [HPF] (ref <=5)
WBC, UA: NONE SEEN WBC/HPF (ref <=5)
Yeast: NONE SEEN [HPF]

## 2015-06-18 ENCOUNTER — Other Ambulatory Visit: Payer: Self-pay | Admitting: Gynecology

## 2015-06-18 MED ORDER — SULFAMETHOXAZOLE-TRIMETHOPRIM 800-160 MG PO TABS: 1.0000 | ORAL_TABLET | Freq: Two times a day (BID) | ORAL | Status: DC

## 2015-06-19 LAB — URINE CULTURE: Colony Count: >=100000

## 2015-07-14 ENCOUNTER — Encounter (HOSPITAL_COMMUNITY): Payer: Self-pay

## 2015-07-14 ENCOUNTER — Encounter (HOSPITAL_COMMUNITY)
Admission: RE | Admit: 2015-07-14 | Discharge: 2015-07-14 | Disposition: A | Discharge status: Home / Self Care | Payer: 59 | Source: Ambulatory Visit | Attending: Gynecology | Admitting: Gynecology

## 2015-07-14 ENCOUNTER — Telehealth: Payer: Self-pay

## 2015-07-14 DIAGNOSIS — N926 Irregular menstruation, unspecified: Secondary | ICD-10-CM | POA: Diagnosis not present

## 2015-07-14 DIAGNOSIS — Z01812 Encounter for preprocedural laboratory examination: Secondary | ICD-10-CM | POA: Diagnosis present

## 2015-07-14 LAB — COMPREHENSIVE METABOLIC PANEL
ALBUMIN: 3.7 g/dL (ref 3.5–5.0)
ALK PHOS: 145 U/L — AB (ref 38–126)
ALT: 111 U/L — AB (ref 14–54)
AST: 58 U/L — ABNORMAL HIGH (ref 15–41)
Anion gap: 6 (ref 5–15)
BILIRUBIN TOTAL: 0.7 mg/dL (ref 0.3–1.2)
BUN: 9 mg/dL (ref 6–20)
CALCIUM: 8.8 mg/dL — AB (ref 8.9–10.3)
CO2: 27 mmol/L (ref 22–32)
Chloride: 105 mmol/L (ref 101–111)
Creatinine, Ser: 0.88 mg/dL (ref 0.44–1.00)
GFR calc Af Amer: >60 mL/min (ref >60)
GFR calc non Af Amer: >60 mL/min (ref >60)
GLUCOSE: 97 mg/dL (ref 65–99)
Potassium: 3.4 mmol/L — ABNORMAL LOW (ref 3.5–5.1)
Sodium: 138 mmol/L (ref 135–145)
TOTAL PROTEIN: 7.8 g/dL (ref 6.5–8.1)

## 2015-07-14 LAB — CBC
HEMATOCRIT: 40 % (ref 36.0–46.0)
HEMOGLOBIN: 13.5 g/dL (ref 12.0–15.0)
MCH: 29.9 pg (ref 26.0–34.0)
MCHC: 33.8 g/dL (ref 30.0–36.0)
MCV: 88.5 fL (ref 78.0–100.0)
Platelets: 287 10*3/uL (ref 150–400)
RBC: 4.52 MIL/uL (ref 3.87–5.11)
RDW: 14 % (ref 11.5–15.5)
WBC: 10.2 10*3/uL (ref 4.0–10.5)

## 2015-07-14 NOTE — Telephone Encounter (Signed)
Patient informed. 

## 2015-07-14 NOTE — Telephone Encounter (Signed)
I would stay on the Megace for now. Her issues will be resolved with her surgery.

## 2015-07-14 NOTE — Patient Instructions (Addendum)
   Your procedure is scheduled on: NOV 8 (TUESDAY)  Enter through the Main Entrance of [facility] at: 6AM  Pick up the phone at the desk and dial [phone] and inform us of your arrival.  Please call this number if you have any problems the morning of surgery: [phone]  DO NOT EAT OR DRINK AFTER MIDNIGHT Monday NIGHT  Take these medicines the morning of surgery with a SIP OF WATER: TAKE SYNTHROID MORNING OF SURGERY  Do not wear jewelry, make-up, or FINGER nail polish No metal in your hair or on your body. Do not wear lotions, powders, perfumes.  You may wear deodorant.  Do not bring valuables to the hospital. Contacts, dentures or bridgework may not be worn into surgery.  Leave suitcase in the car. After Surgery it may be brought to your room. For patients being admitted to the hospital, checkout time is 11:00am the day of discharge.

## 2015-07-14 NOTE — Telephone Encounter (Signed)
I called patient and discuss her pre op labs/recommendations with her. In the course of that conversation pattient asked me to tell you that she is taking the Megace and continues to spot and at times pass some clots.  She was adamant that she does not want to have another u/s as you had previously mentioned.

## 2015-07-15 ENCOUNTER — Telehealth: Payer: Self-pay

## 2015-07-15 NOTE — Telephone Encounter (Signed)
I was told previously when questioning her about this that her other physician is aware of this and it was not an issue with him.  A lot of times these values normalize and I would have no way of knowing they remained abnormal and to we did the preoperative blood. The issue now is that she will be having general anesthesia which involves liver metabolism. I just need her primary physician to relay that it is okay to proceed with surgery. She can call his office or we can call his office and have him give [place] a verbal cancer and I'm okay with that.

## 2015-07-15 NOTE — Telephone Encounter (Addendum)
Patient called to tell me to please not cancel her surgery until she has time to see PCP on Friday at 11:00am and find out what is going on.  I called her back to assure her I had no plans to cancel her surgery.  I told her I would be out of the office until next Weds and hopefully she would know something by then but I would check with patient before I ever cancelled a case.  She proceeded to tell me how upset she is with this whole situation. She said her LFT's have been like this for several years and you knew it and you waited until a two weeks from surgery to mention it. She said you were never concerned before why are you so concerned now. I explained with having surgery you just wanted to get clearance. She said it was very frustrating and she feels like getting the clearance was put on her, you could have called him with your concerns she said. (Her doctor is in [facility] system and can view labs.)  She also said her pre op appt at [facility] was not a pleasant experience and proceeded to relay an experience with having someone draw her blood with multiple attempts.  She said that she did not want to have that nurse ever again. She said she would tell you who it was so (would not tell me) so that you could make sure. She said her daughter works at [employer] and she knows she does not have to put up with that.  Just wanted to relay these things to you as they were relayed to me.  (I did tell her I would let you know. )

## 2015-07-15 NOTE — Telephone Encounter (Signed)
Patient informed. She seemed in much better spirits this call. The PCP called her and moved her appt up until in the am at 9:30am.  She will have med clearance note faxed attn:Kari so that we can be sure it gets to you.

## 2015-07-16 ENCOUNTER — Other Ambulatory Visit (HOSPITAL_COMMUNITY): Payer: Self-pay | Admitting: Pulmonary Disease

## 2015-07-16 DIAGNOSIS — R7989 Other specified abnormal findings of blood chemistry: Secondary | ICD-10-CM

## 2015-07-16 DIAGNOSIS — R945 Abnormal results of liver function studies: Secondary | ICD-10-CM

## 2015-07-16 DIAGNOSIS — E041 Nontoxic single thyroid nodule: Secondary | ICD-10-CM

## 2015-07-16 NOTE — Telephone Encounter (Signed)
thanks

## 2015-07-21 ENCOUNTER — Ambulatory Visit (HOSPITAL_COMMUNITY)
Admission: RE | Admit: 2015-07-21 | Discharge: 2015-07-21 | Disposition: A | Discharge status: Home / Self Care | Payer: 59 | Source: Ambulatory Visit | Attending: Pulmonary Disease | Admitting: Pulmonary Disease

## 2015-07-21 DIAGNOSIS — R7989 Other specified abnormal findings of blood chemistry: Secondary | ICD-10-CM | POA: Diagnosis present

## 2015-07-21 DIAGNOSIS — E041 Nontoxic single thyroid nodule: Secondary | ICD-10-CM | POA: Diagnosis not present

## 2015-07-21 DIAGNOSIS — K760 Fatty (change of) liver, not elsewhere classified: Secondary | ICD-10-CM | POA: Diagnosis not present

## 2015-07-21 DIAGNOSIS — R945 Abnormal results of liver function studies: Secondary | ICD-10-CM

## 2015-07-22 ENCOUNTER — Ambulatory Visit (INDEPENDENT_AMBULATORY_CARE_PROVIDER_SITE_OTHER): Payer: 59 | Admitting: Gynecology

## 2015-07-22 ENCOUNTER — Encounter: Payer: Self-pay | Admitting: Gynecology

## 2015-07-22 VITALS — BP 124/82

## 2015-07-22 DIAGNOSIS — N926 Irregular menstruation, unspecified: Secondary | ICD-10-CM | POA: Diagnosis not present

## 2015-07-22 NOTE — Patient Instructions (Signed)
Followup for surgery as scheduled. 

## 2015-07-22 NOTE — Progress Notes (Signed)
BLU MCGLAUN 1955-11-26 [id number]   Preoperative consult  Chief complaint: irregular bleeding  History of present illness: 59 y.o. G4P0013 with long history of repetitive irregular bleeding status post for D&Cs over the past several years. Pathology 2014 showed simple hyperplasia without atypia. Most recently had episode of bleeding March 2016 with hysteroscopy D&C showing benign endometrial polyp. She's bled on and off since then requiring Megace to control her bleeding. Options for management reviewed to include hormonal manipulation, endometrial ablation up to including hysterectomy. The patient elects for hysterectomy and is admitted for LAVH BSO.  Past medical history,surgical history, medications, allergies, family history and social history were all reviewed and documented in the EPIC chart.  ROS:  Was performed and pertinent positives and negatives are included in the history of present illness.  Exam:  [name] General: well developed, well nourished female, no acute distress HEENT: normal  Lungs: clear to auscultation without wheezing, rales or rhonchi  Cardiac: regular rate without rubs, murmurs or gallops  Abdomen: soft, nontender without masses, guarding, rebound, organomegaly  Pelvic: external bus vagina: normal   Cervix: grossly normal  Uterus: normal size, midline and mobile, nontender  Adnexa: without masses or tenderness    Assessment/Plan:  59 y.o. [id number] with postmenopausal bleeding now going on 7-8 months with history of 4 D&C's for endometrial polyps in the past most recently March 2016. Options for management reviewed with her and she is ultimately decided to proceed with LAVH BSO.  I reviewed with her and her husband that we will attempt a laparoscopic approach but at any time during the procedure if unexpected findings or palpitations arise that I may convert this to a total abdominal hysterectomy bilateral salpingo-oophorectomy with a larger incision and a  longer recovery period. Sexuality following hysterectomy was also reviewed and the potential for persistent dyspareunia and persistent orgasmic dysfunction was also discussed. The absolute irreversible sterility associated with hysterectomy was also discussed.  The expected intraoperative and postoperative courses as well as the recovery period were reviewed. The risks of infection, prolonged antibiotics, reoperation for abscess or hematoma formation was discussed. The risks of hemorrhage necessitating transfusion and the risks of transfusion reaction, hepatitis, HIV, mad cow disease and other unknown entities was also discussed. Incisional complications to include opening and draining of incisions and closure by secondary intention, dehiscence and long-term issues of keloid/cosmetics and hernia formation were reviewed. The risk of inadvertent injury to internal organs including bowel, bladder, ureters, vessels, nerves either immediately recognized or delay recognized necessitating major exploratory reparative surgeries and future reparative surgeries including bowel resection, ostomy formation, bladder repair, ureteral damage repair was discussed with her. The patient and her husband's questions were answered to their satisfaction and she is ready to proceed with surgery.  Patient does have mild elevated liver functions she has had for years which has been attributed to fatty liver by her primary physician and he has given her preoperative clearance for the surgery.    [name], 11:36 AM 07/22/2015

## 2015-07-22 NOTE — H&P (Signed)
Rachel Branch Nov 13, 1955 [id number]   History and Physical  Chief complaint: irregular bleeding  History of present illness: 59 y.o. G4P0013 with long history of repetitive irregular bleeding status post for D&Cs over the past several years. Pathology 2014 showed simple hyperplasia without atypia. Most recently had episode of bleeding March 2016 with hysteroscopy D&C showing benign endometrial polyp. She's bled on and off since then requiring Megace to control her bleeding. Options for management reviewed to include hormonal manipulation, endometrial ablation up to including hysterectomy. The patient elects for hysterectomy and is admitted for LAVH BSO.  Past medical history,surgical history, medications, allergies, family history and social history were all reviewed and documented in the EPIC chart.  ROS:  Was performed and pertinent positives and negatives are included in the history of present illness.  Exam:  [name] General: well developed, well nourished female, no acute distress HEENT: normal  Lungs: clear to auscultation without wheezing, rales or rhonchi  Cardiac: regular rate without rubs, murmurs or gallops  Abdomen: soft, nontender without masses, guarding, rebound, organomegaly  Pelvic: external bus vagina: normal   Cervix: grossly normal  Uterus: normal size, midline and mobile, nontender  Adnexa: without masses or tenderness    Assessment/Plan:  59 y.o. [id number] with postmenopausal bleeding now going on 7-8 months with history of 4 D&C's for endometrial polyps in the past most recently March 2016. Options for management reviewed with her and she is ultimately decided to proceed with LAVH BSO.  I reviewed with her and her husband that we will attempt a laparoscopic approach but at any time during the procedure if unexpected findings or palpitations arise that I may convert this to a total abdominal hysterectomy bilateral salpingo-oophorectomy with a larger incision and a  longer recovery period. Sexuality following hysterectomy was also reviewed and the potential for persistent dyspareunia and persistent orgasmic dysfunction was also discussed. The absolute irreversible sterility associated with hysterectomy was also discussed.  The expected intraoperative and postoperative courses as well as the recovery period were reviewed. The risks of infection, prolonged antibiotics, reoperation for abscess or hematoma formation was discussed. The risks of hemorrhage necessitating transfusion and the risks of transfusion reaction, hepatitis, HIV, mad cow disease and other unknown entities was also discussed. Incisional complications to include opening and draining of incisions and closure by secondary intention, dehiscence and long-term issues of keloid/cosmetics and hernia formation were reviewed. The risk of inadvertent injury to internal organs including bowel, bladder, ureters, vessels, nerves either immediately recognized or delay recognized necessitating major exploratory reparative surgeries and future reparative surgeries including bowel resection, ostomy formation, bladder repair, ureteral damage repair was discussed with her. The patient and her husband's questions were answered to their satisfaction and she is ready to proceed with surgery.  Patient does have mild elevated liver functions she has had for years which has been attributed to fatty liver by her primary physician and he has given her preoperative clearance for the surgery.    [name], 11:43 AM 07/22/2015

## 2015-07-27 MED ORDER — DEXTROSE 5 % IV SOLN
2.0000 g | INTRAVENOUS | Status: AC
  Administered 2015-07-28: 2 g via INTRAVENOUS
  Filled 2015-07-27: qty 2

## 2015-07-27 NOTE — Anesthesia Preprocedure Evaluation (Addendum)
Anesthesia Evaluation  Patient identified by MRN, date of birth, ID band Patient awake    Reviewed: Allergy & Precautions, Patient's Chart, lab work & pertinent test results  Airway Mallampati: IV  TM Distance: >3 FB Neck ROM: Full  Mouth opening: Limited Mouth Opening  Dental no notable dental hx. (+) Teeth Intact   Pulmonary  Snores- ?undiagnosed OSA   Pulmonary exam normal breath sounds clear to auscultation       Cardiovascular negative cardio ROS Normal cardiovascular exam Rhythm:Regular Rate:Normal     Neuro/Psych negative neurological ROS  negative psych ROS   GI/Hepatic negative GI ROS, Neg liver ROS, Pernicious anemia   Endo/Other  Hypothyroidism Morbid obesity  Renal/GU negative Renal ROS  negative genitourinary   Musculoskeletal negative musculoskeletal ROS (+)   Abdominal   Peds  Hematology  (+) anemia ,   Anesthesia Other Findings   Reproductive/Obstetrics Endometrial polyp PMB                            Anesthesia Physical  Anesthesia Plan  ASA: II  Anesthesia Plan: General   Post-op Pain Management:    Induction: Intravenous  Airway Management Planned: Oral ETT  Additional Equipment:   Intra-op Plan:   Post-operative Plan: Extubation in OR  Informed Consent: I have reviewed the patients History and Physical, chart, labs and discussed the procedure including the risks, benefits and alternatives for the proposed anesthesia with the patient or authorized representative who has indicated his/her understanding and acceptance.   Dental advisory given  Plan Discussed with: CRNA, Anesthesiologist and Surgeon  Anesthesia Plan Comments:        Anesthesia Quick Evaluation

## 2015-07-28 ENCOUNTER — Encounter (HOSPITAL_COMMUNITY)
Admission: RE | Disposition: A | Discharge status: Home / Self Care | Payer: Self-pay | Source: Ambulatory Visit | Attending: Gynecology

## 2015-07-28 ENCOUNTER — Ambulatory Visit (HOSPITAL_COMMUNITY): Payer: 59 | Admitting: Anesthesiology

## 2015-07-28 ENCOUNTER — Ambulatory Visit (HOSPITAL_COMMUNITY)
Admission: RE | Admit: 2015-07-28 | Discharge: 2015-07-29 | Disposition: A | Discharge status: Home / Self Care | Payer: 59 | Source: Ambulatory Visit | Attending: Gynecology | Admitting: Gynecology

## 2015-07-28 ENCOUNTER — Encounter (HOSPITAL_COMMUNITY): Payer: Self-pay | Admitting: Anesthesiology

## 2015-07-28 DIAGNOSIS — N926 Irregular menstruation, unspecified: Secondary | ICD-10-CM | POA: Diagnosis present

## 2015-07-28 DIAGNOSIS — K760 Fatty (change of) liver, not elsewhere classified: Secondary | ICD-10-CM | POA: Diagnosis not present

## 2015-07-28 DIAGNOSIS — D252 Subserosal leiomyoma of uterus: Secondary | ICD-10-CM | POA: Insufficient documentation

## 2015-07-28 DIAGNOSIS — N950 Postmenopausal bleeding: Secondary | ICD-10-CM | POA: Insufficient documentation

## 2015-07-28 DIAGNOSIS — Z23 Encounter for immunization: Secondary | ICD-10-CM | POA: Insufficient documentation

## 2015-07-28 DIAGNOSIS — Z6839 Body mass index (BMI) 39.0-39.9, adult: Secondary | ICD-10-CM | POA: Insufficient documentation

## 2015-07-28 HISTORY — PX: LAPAROSCOPIC ASSISTED VAGINAL HYSTERECTOMY: SHX5398

## 2015-07-28 HISTORY — PX: CYSTOSCOPY: SHX5120

## 2015-07-28 HISTORY — PX: LAPAROSCOPIC BILATERAL SALPINGO OOPHERECTOMY: SHX5890

## 2015-07-28 HISTORY — PX: LAPAROSCOPIC LYSIS OF ADHESIONS: SHX5905

## 2015-07-28 LAB — PREGNANCY, URINE: PREG TEST UR: NEGATIVE

## 2015-07-28 SURGERY — HYSTERECTOMY, VAGINAL, LAPAROSCOPY-ASSISTED
Anesthesia: General | Site: Bladder

## 2015-07-28 MED ORDER — PROMETHAZINE HCL 25 MG/ML IJ SOLN
INTRAMUSCULAR | Status: AC
  Filled 2015-07-28: qty 1

## 2015-07-28 MED ORDER — KETOROLAC TROMETHAMINE 30 MG/ML IJ SOLN
INTRAMUSCULAR | Status: AC
  Filled 2015-07-28: qty 1

## 2015-07-28 MED ORDER — INFLUENZA VAC SPLIT QUAD 0.5 ML IM SUSY
0.5000 mL | PREFILLED_SYRINGE | INTRAMUSCULAR | Status: AC
  Administered 2015-07-29: 0.5 mL via INTRAMUSCULAR
  Filled 2015-07-28: qty 0.5

## 2015-07-28 MED ORDER — ONDANSETRON HCL 4 MG PO TABS: 4.0000 mg | ORAL_TABLET | Freq: Four times a day (QID) | ORAL | Status: DC | PRN

## 2015-07-28 MED ORDER — FENTANYL CITRATE (PF) 250 MCG/5ML IJ SOLN
INTRAMUSCULAR | Status: AC
  Filled 2015-07-28: qty 25

## 2015-07-28 MED ORDER — GLYCOPYRROLATE 0.2 MG/ML IJ SOLN
INTRAMUSCULAR | Status: DC | PRN
  Administered 2015-07-28: .8 mg via INTRAVENOUS

## 2015-07-28 MED ORDER — ONDANSETRON HCL 4 MG/2ML IJ SOLN: 4.0000 mg | Freq: Four times a day (QID) | INTRAMUSCULAR | Status: DC | PRN

## 2015-07-28 MED ORDER — SCOPOLAMINE 1 MG/3DAYS TD PT72
MEDICATED_PATCH | TRANSDERMAL | Status: AC
  Filled 2015-07-28: qty 1

## 2015-07-28 MED ORDER — SUCCINYLCHOLINE CHLORIDE 20 MG/ML IJ SOLN
INTRAMUSCULAR | Status: DC | PRN
  Administered 2015-07-28: 120 mg via INTRAVENOUS

## 2015-07-28 MED ORDER — HYDROMORPHONE HCL 1 MG/ML IJ SOLN
INTRAMUSCULAR | Status: AC
  Filled 2015-07-28: qty 1

## 2015-07-28 MED ORDER — GLYCOPYRROLATE 0.2 MG/ML IJ SOLN
INTRAMUSCULAR | Status: AC
  Filled 2015-07-28: qty 4

## 2015-07-28 MED ORDER — OXYCODONE-ACETAMINOPHEN 5-325 MG PO TABS: 1.0000 | ORAL_TABLET | ORAL | Status: DC | PRN

## 2015-07-28 MED ORDER — PROMETHAZINE HCL 25 MG/ML IJ SOLN
6.2500 mg | INTRAMUSCULAR | Status: DC | PRN
  Administered 2015-07-28: 12.5 mg via INTRAVENOUS

## 2015-07-28 MED ORDER — SCOPOLAMINE 1 MG/3DAYS TD PT72: 1.0000 | MEDICATED_PATCH | TRANSDERMAL | Status: DC

## 2015-07-28 MED ORDER — PROPOFOL 10 MG/ML IV BOLUS
INTRAVENOUS | Status: AC
  Filled 2015-07-28: qty 20

## 2015-07-28 MED ORDER — MIDAZOLAM HCL 5 MG/5ML IJ SOLN
INTRAMUSCULAR | Status: DC | PRN
  Administered 2015-07-28: 2 mg via INTRAVENOUS

## 2015-07-28 MED ORDER — KETOROLAC TROMETHAMINE 30 MG/ML IJ SOLN
30.0000 mg | Freq: Four times a day (QID) | INTRAMUSCULAR | Status: DC
Start: 2015-07-28 — End: 2015-07-29

## 2015-07-28 MED ORDER — LACTATED RINGERS IV SOLN
INTRAVENOUS | Status: DC
  Administered 2015-07-28 (×3): via INTRAVENOUS

## 2015-07-28 MED ORDER — BUPIVACAINE HCL (PF) 0.25 % IJ SOLN
INTRAMUSCULAR | Status: AC
  Filled 2015-07-28: qty 30

## 2015-07-28 MED ORDER — DEXAMETHASONE SODIUM PHOSPHATE 10 MG/ML IJ SOLN
INTRAMUSCULAR | Status: DC | PRN
  Administered 2015-07-28: 10 mg via INTRAVENOUS

## 2015-07-28 MED ORDER — BUPIVACAINE HCL (PF) 0.25 % IJ SOLN
INTRAMUSCULAR | Status: DC | PRN
  Administered 2015-07-28: 7 mL

## 2015-07-28 MED ORDER — SCOPOLAMINE 1 MG/3DAYS TD PT72
1.0000 | MEDICATED_PATCH | Freq: Once | TRANSDERMAL | Status: DC
  Administered 2015-07-28: 1.5 mg via TRANSDERMAL

## 2015-07-28 MED ORDER — PROPOFOL 10 MG/ML IV BOLUS
INTRAVENOUS | Status: DC | PRN
  Administered 2015-07-28: 200 mg via INTRAVENOUS

## 2015-07-28 MED ORDER — LIDOCAINE-EPINEPHRINE 1 %-1:100000 IJ SOLN
INTRAMUSCULAR | Status: DC | PRN
  Administered 2015-07-28: 10 mL

## 2015-07-28 MED ORDER — NEOSTIGMINE METHYLSULFATE 10 MG/10ML IV SOLN
INTRAVENOUS | Status: AC
  Filled 2015-07-28: qty 1

## 2015-07-28 MED ORDER — SUCCINYLCHOLINE CHLORIDE 20 MG/ML IJ SOLN
INTRAMUSCULAR | Status: AC
  Filled 2015-07-28: qty 1

## 2015-07-28 MED ORDER — NEOSTIGMINE METHYLSULFATE 10 MG/10ML IV SOLN
INTRAVENOUS | Status: DC | PRN
  Administered 2015-07-28: 5 mg via INTRAVENOUS

## 2015-07-28 MED ORDER — MIDAZOLAM HCL 2 MG/2ML IJ SOLN
INTRAMUSCULAR | Status: AC
  Filled 2015-07-28: qty 4

## 2015-07-28 MED ORDER — LACTATED RINGERS IR SOLN: Status: DC | PRN

## 2015-07-28 MED ORDER — HYDROMORPHONE HCL 1 MG/ML IJ SOLN: 0.2500 mg | INTRAMUSCULAR | Status: DC | PRN

## 2015-07-28 MED ORDER — DEXAMETHASONE SODIUM PHOSPHATE 10 MG/ML IJ SOLN
INTRAMUSCULAR | Status: AC
  Filled 2015-07-28: qty 1

## 2015-07-28 MED ORDER — SODIUM CHLORIDE 0.9 % IR SOLN
Status: DC | PRN
  Administered 2015-07-28: 3000 mL

## 2015-07-28 MED ORDER — KETOROLAC TROMETHAMINE 30 MG/ML IJ SOLN
30.0000 mg | Freq: Four times a day (QID) | INTRAMUSCULAR | Status: DC
  Administered 2015-07-28 – 2015-07-29 (×3): 30 mg via INTRAVENOUS
  Filled 2015-07-28 (×3): qty 1

## 2015-07-28 MED ORDER — FENTANYL CITRATE (PF) 100 MCG/2ML IJ SOLN
INTRAMUSCULAR | Status: DC | PRN
  Administered 2015-07-28: 100 ug via INTRAVENOUS
  Administered 2015-07-28: 50 ug via INTRAVENOUS
  Administered 2015-07-28: 100 ug via INTRAVENOUS

## 2015-07-28 MED ORDER — DEXTROSE-NACL 5-0.9 % IV SOLN
INTRAVENOUS | Status: DC
  Administered 2015-07-28: 17:00:00 via INTRAVENOUS

## 2015-07-28 MED ORDER — LEVOTHYROXINE SODIUM 112 MCG PO TABS
112.0000 ug | ORAL_TABLET | Freq: Every day | ORAL | Status: DC
  Administered 2015-07-29: 112 ug via ORAL
  Filled 2015-07-28: qty 1

## 2015-07-28 MED ORDER — MORPHINE SULFATE (PF) 4 MG/ML IV SOLN
1.0000 mg | INTRAVENOUS | Status: DC | PRN
  Administered 2015-07-28: 1 mg via INTRAVENOUS
  Filled 2015-07-28: qty 1

## 2015-07-28 MED ORDER — HYDROMORPHONE HCL 1 MG/ML IJ SOLN
INTRAMUSCULAR | Status: DC | PRN
  Administered 2015-07-28: 1 mg via INTRAVENOUS

## 2015-07-28 MED ORDER — KETOROLAC TROMETHAMINE 30 MG/ML IJ SOLN
INTRAMUSCULAR | Status: DC | PRN
  Administered 2015-07-28: 30 mg via INTRAVENOUS

## 2015-07-28 MED ORDER — ONDANSETRON HCL 4 MG/2ML IJ SOLN
INTRAMUSCULAR | Status: AC
  Filled 2015-07-28: qty 2

## 2015-07-28 MED ORDER — LIDOCAINE HCL (CARDIAC) 20 MG/ML IV SOLN
INTRAVENOUS | Status: DC | PRN
  Administered 2015-07-28: 100 mg via INTRAVENOUS

## 2015-07-28 MED ORDER — LIDOCAINE HCL (CARDIAC) 20 MG/ML IV SOLN
INTRAVENOUS | Status: AC
  Filled 2015-07-28: qty 5

## 2015-07-28 MED ORDER — ROCURONIUM BROMIDE 100 MG/10ML IV SOLN
INTRAVENOUS | Status: AC
  Filled 2015-07-28: qty 1

## 2015-07-28 MED ORDER — DIPHENHYDRAMINE HCL 25 MG PO CAPS
50.0000 mg | ORAL_CAPSULE | Freq: Four times a day (QID) | ORAL | Status: DC | PRN
Start: 2015-07-28 — End: 2015-07-29

## 2015-07-28 MED ORDER — ROCURONIUM BROMIDE 100 MG/10ML IV SOLN
INTRAVENOUS | Status: DC | PRN
  Administered 2015-07-28: 10 mg via INTRAVENOUS
  Administered 2015-07-28: 30 mg via INTRAVENOUS
  Administered 2015-07-28: 10 mg via INTRAVENOUS

## 2015-07-28 MED ORDER — ONDANSETRON HCL 4 MG/2ML IJ SOLN
INTRAMUSCULAR | Status: DC | PRN
  Administered 2015-07-28: 4 mg via INTRAVENOUS

## 2015-07-28 MED ORDER — LIDOCAINE-EPINEPHRINE 1 %-1:100000 IJ SOLN
INTRAMUSCULAR | Status: AC
  Filled 2015-07-28: qty 1

## 2015-07-28 SURGICAL SUPPLY — 56 items
BAG SPEC RTRVL LRG 6X4 10 (ENDOMECHANICALS) ×3
BARRIER ADHS 3X4 INTERCEED (GAUZE/BANDAGES/DRESSINGS) IMPLANT
BLADE SURG 15 STRL LF C SS BP (BLADE) ×3 IMPLANT
BLADE SURG 15 STRL SS (BLADE) ×5
BRR ADH 4X3 ABS CNTRL BYND (GAUZE/BANDAGES/DRESSINGS)
CABLE HIGH FREQUENCY MONO STRZ (ELECTRODE) IMPLANT
CATH ROBINSON RED A/P 16FR (CATHETERS) ×3 IMPLANT
CLOSURE WOUND 1/4 X3 (GAUZE/BANDAGES/DRESSINGS)
CLOTH BEACON ORANGE TIMEOUT ST (SAFETY) ×5 IMPLANT
CONT PATH 16OZ SNAP LID 3702 (MISCELLANEOUS) ×5 IMPLANT
COVER BACK TABLE 60X90IN (DRAPES) ×5 IMPLANT
COVER MAYO STAND STRL (DRAPES) ×5 IMPLANT
DECANTER SPIKE VIAL GLASS SM (MISCELLANEOUS) ×10 IMPLANT
DRSG COVADERM PLUS 2X2 (GAUZE/BANDAGES/DRESSINGS) ×10 IMPLANT
DRSG OPSITE POSTOP 3X4 (GAUZE/BANDAGES/DRESSINGS) ×5 IMPLANT
DURAPREP 26ML APPLICATOR (WOUND CARE) ×5 IMPLANT
ELECT REM PT RETURN 9FT ADLT (ELECTROSURGICAL) ×5
ELECTRODE REM PT RTRN 9FT ADLT (ELECTROSURGICAL) ×3 IMPLANT
EVACUATOR SMOKE 8.L (FILTER) ×5 IMPLANT
FILTER SMOKE EVAC LAPAROSHD (FILTER) ×5 IMPLANT
GLOVE BIO SURGEON STRL SZ7.5 (GLOVE) ×15 IMPLANT
GLOVE BIOGEL PI IND STRL 7.0 (GLOVE) ×6 IMPLANT
GLOVE BIOGEL PI INDICATOR 7.0 (GLOVE) ×4
GOWN STRL REUS W/TWL LRG LVL3 (GOWN DISPOSABLE) ×15 IMPLANT
HEMOSTAT SURGICEL 4X8 (HEMOSTASIS) ×2 IMPLANT
LEGGING LITHOTOMY PAIR STRL (DRAPES) ×5 IMPLANT
LIQUID BAND (GAUZE/BANDAGES/DRESSINGS) ×2 IMPLANT
NDL MAYO CATGUT SZ4 TPR NDL (NEEDLE) IMPLANT
NEEDLE MAYO CATGUT SZ4 (NEEDLE) IMPLANT
NS IRRIG 1000ML POUR BTL (IV SOLUTION) ×5 IMPLANT
PACK LAPAROSCOPY BASIN (CUSTOM PROCEDURE TRAY) ×5 IMPLANT
PACK LAVH (CUSTOM PROCEDURE TRAY) ×5 IMPLANT
PACK ROBOTIC GOWN (GOWN DISPOSABLE) ×5 IMPLANT
PAD OB MATERNITY 4.3X12.25 (PERSONAL CARE ITEMS) ×5 IMPLANT
PAD POSITIONING PINK XL (MISCELLANEOUS) ×5 IMPLANT
POUCH SPECIMEN RETRIEVAL 10MM (ENDOMECHANICALS) ×5 IMPLANT
SCISSORS LAP 5X35 DISP (ENDOMECHANICALS) IMPLANT
SET CYSTO W/LG BORE CLAMP LF (SET/KITS/TRAYS/PACK) ×2 IMPLANT
SET IRRIG TUBING LAPAROSCOPIC (IRRIGATION / IRRIGATOR) ×5 IMPLANT
SHEARS HARMONIC ACE PLUS 36CM (ENDOMECHANICALS) ×5 IMPLANT
SLEEVE XCEL OPT CAN 5 100 (ENDOMECHANICALS) ×3 IMPLANT
SOLUTION ELECTROLUBE (MISCELLANEOUS) IMPLANT
STRIP CLOSURE SKIN 1/4X3 (GAUZE/BANDAGES/DRESSINGS) IMPLANT
SUT PLAIN 4 0 FS 2 27 (SUTURE) ×5 IMPLANT
SUT VIC AB 0 CT1 18XCR BRD8 (SUTURE) ×9 IMPLANT
SUT VIC AB 0 CT1 36 (SUTURE) ×5 IMPLANT
SUT VIC AB 0 CT1 8-18 (SUTURE) ×10
SUT VICRYL 0 TIES 12 18 (SUTURE) ×5 IMPLANT
SUT VICRYL 0 UR6 27IN ABS (SUTURE) ×5 IMPLANT
SYR BULB IRRIGATION 50ML (SYRINGE) ×5 IMPLANT
TOWEL OR 17X24 6PK STRL BLUE (TOWEL DISPOSABLE) ×10 IMPLANT
TRAY FOLEY CATH SILVER 14FR (SET/KITS/TRAYS/PACK) ×5 IMPLANT
TROCAR XCEL NON-BLD 11X100MML (ENDOMECHANICALS) ×5 IMPLANT
TROCAR XCEL NON-BLD 5MMX100MML (ENDOMECHANICALS) ×10 IMPLANT
WARMER LAPAROSCOPE (MISCELLANEOUS) ×5 IMPLANT
WATER STERILE IRR 1000ML POUR (IV SOLUTION) ×3 IMPLANT

## 2015-07-28 NOTE — Anesthesia Postprocedure Evaluation (Signed)
Anesthesia Post Note  Patient: Rachel Branch  Procedure(s) Performed: Procedure(s) (LRB): LAPAROSCOPIC ASSISTED VAGINAL HYSTERECTOMY (N/A) LAPAROSCOPIC BILATERAL SALPINGO OOPHORECTOMY (Bilateral) CYSTOSCOPY (N/A) LAPAROSCOPIC LYSIS OF ADHESIONS (N/A)  Anesthesia type: general  Patient location: PACU  Post pain: Pain level controlled  Post assessment: Patient's Cardiovascular Status Stable  Last Vitals:  Filed Vitals:   07/28/15 1130  BP: 131/78  Pulse: 83  Temp:   Resp: 13    Post vital signs: Reviewed and stable  Level of consciousness: sedated  Complications: No apparent anesthesia complications

## 2015-07-28 NOTE — H&P (Signed)
  The patient was examined.  I reviewed the proposed surgery and consent form with the patient.  I again reviewed the ovarian conservation issue with her and she wants both ovaries removed. The plan is to monitor postoperatively and if she would develop menopausal symptoms then she would consider HRT at that time. We have previously discussed the risks benefits of HRT to include the WHI study with increased risk of stroke heart attack DVT and breast cancer. The dictated history and physical is current and accurate and all questions were answered. The patient is ready to proceed with surgery and has a realistic understanding and expectation for the outcome.   [name], 7:00 AM 07/28/2015

## 2015-07-28 NOTE — Addendum Note (Signed)
Addendum  created 07/28/15 1600 by Ignacia Bayley, CRNA   Modules edited: Notes Section   Notes Section:  File: [id number]

## 2015-07-28 NOTE — Anesthesia Postprocedure Evaluation (Signed)
  Anesthesia Post-op Note  Patient: Rachel Branch  Procedure(s) Performed: Procedure(s): LAPAROSCOPIC ASSISTED VAGINAL HYSTERECTOMY (N/A) LAPAROSCOPIC BILATERAL SALPINGO OOPHORECTOMY (Bilateral) CYSTOSCOPY (N/A) LAPAROSCOPIC LYSIS OF ADHESIONS (N/A)  Patient Location: [facility]  Anesthesia Type:General  Level of Consciousness: awake  Airway and Oxygen Therapy: Patient Spontanous Breathing  Post-op Pain: mild  Post-op Assessment: Patient's Cardiovascular Status Stable and Respiratory Function Stable              Post-op Vital Signs: stable  Last Vitals:  Filed Vitals:   07/28/15 1240  BP: 141/82  Pulse: 78  Temp: 37.1 C  Resp: 16    Complications: No apparent anesthesia complications

## 2015-07-28 NOTE — Progress Notes (Signed)
Patient ID: Rachel Branch, female   DOB: 1955/10/17, 59 y.o.   MRN: [id number] Rachel Branch [birth year] [id number]   Day of Surgery s/p Procedure(s): LAPAROSCOPIC ASSISTED VAGINAL HYSTERECTOMY LAPAROSCOPIC BILATERAL SALPINGO OOPHORECTOMY CYSTOSCOPY LAPAROSCOPIC LYSIS OF ADHESIONS  Subjective: Patient reports no acute distress, pain severity reported mild, Yes.   taking PO, foley catheter in place, Yes.   ambulating, No. passing flatus  Objective: Vital signs in last 24 hours: Temp:  [97.3 F (36.3 C)-98.7 F (37.1 C)] 98 F (36.7 C) (11/08 1630) Pulse Rate:  [67-94] 83 (11/08 1630) Resp:  [13-20] 18 (11/08 1630) BP: (111-145)/(61-89) 111/63 mmHg (11/08 1630) SpO2:  [93 %-96 %] 93 % (11/08 1630) Weight:  [235 lb (106.595 kg)] 235 lb (106.595 kg) (11/08 1150) Last BM Date: 07/27/15  EXAM General: awake, alert GI: soft, minimal tenderness, dressings dry Lower Extremities: Without swelling or tenderness Vaginal Bleeding: reported scant  Lab Results:  No results for input(s): WBC, HGB, HCT, PLT in the last 72 hours.  Assessment: s/p Procedure(s): LAPAROSCOPIC ASSISTED VAGINAL HYSTERECTOMY LAPAROSCOPIC BILATERAL SALPINGO OOPHORECTOMY CYSTOSCOPY LAPAROSCOPIC LYSIS OF ADHESIONS: stable and progressing well  Plan: Continue routine post operative care, Advance diet Encourage ambulation Advance to PO medication     [name], 5:17 PM 07/28/2015

## 2015-07-28 NOTE — Anesthesia Procedure Notes (Signed)
Procedure Name: Intubation Date/Time: 07/28/2015 7:25 AM Performed by: Riki Sheer Pre-anesthesia Checklist: Patient identified, Emergency Drugs available, Suction available, Patient being monitored and Timeout performed Patient Re-evaluated:Patient Re-evaluated prior to inductionOxygen Delivery Method: Circle system utilized Preoxygenation: Pre-oxygenation with 100% oxygen Intubation Type: IV induction Ventilation: Mask ventilation without difficulty Laryngoscope Size: Miller and 2 Grade View: Grade II Tube type: Oral Tube size: 7.0 mm Number of attempts: 1 Airway Equipment and Method: Stylet Placement Confirmation: ETT inserted through vocal cords under direct vision,  positive ETCO2,  CO2 detector and breath sounds checked- equal and bilateral Secured at: 20 cm Tube secured with: Tape Dental Injury: Teeth and Oropharynx as per pre-operative assessment

## 2015-07-28 NOTE — OR Nursing (Signed)
Patient slid toward the head of the bed during steep trendelenburg. Patient moved down toward the foot of bed prior to vaginal portion of procedure with the assistance of Dr Phineas Real and Dr Toney Rakes and Riki Sheer CRNA. Patient legs raised in hi lithotomy and all extremities checked and pressure points padded.

## 2015-07-28 NOTE — Op Note (Addendum)
Rachel Branch 07-13-1956 [id number]   Post Operative Note   Date of surgery:  07/28/2015  Pre Op Dx:  Irregular bleeding  Post Op Dx:  Irregular bleeding, abdominal adhesions  Procedure:  Laparoscopic-assisted vaginal hysterectomy, bilateral salpingo-oophorectomy, lysis of adhesions, cystoscopy  Surgeon:  Anastasio Auerbach  Assistant:  Uvaldo Rising  Anesthesia:  General  EBL:   150 cc anesthesia reported  Complications:  None  Specimen:  Uterus, right and left ovaries, right and left tubal segments to pathology  Findings: EUA:  External BUS vagina normal. Cervix normal. Uterus grossly normal size midline mobile. Adnexa without masses   Operative:  Anterior cul-de-sac normal. Posterior cul-de-sac normal. Uterus grossly normal size with serosal irregularity consistent with subserosal leiomyoma. Right and left ovaries grossly normal. Right and left fallopian tubes grossly normal with evidence of prior tubal sterilization. Omental to lower anterior abdominal adhesion lysed with harmonic scalpel. Upper abdominal exam limited by abundant omentum. Liver not clearly visualized. Appendix sleep normal. No upper abdominal adhesions or gross pathology noted.  Procedure:  The patient was taken to the operating room, was placed in the low dorsal lithotomy position, underwent general anesthesia, received an abdominal preparation with DuraPrep per nursing personnel and a vaginal/perineal preparation with Betadine solution per physician and an EUA was performed. The timeout was performed by the surgical team. A Hulka tenaculum was placed on the cervix and an indwelling Foley catheter was placed without difficulty. The patient was draped in the usual fashion and a transverse infraumbilical incision was made. The abdomen was directly entered using a 10 mm direct entry trocar under direct visualization without difficulty. Right and left 5 mm suprapubic ports were placed under direct visualization after  transillumination for the vessels without difficulty. Examination of the pelvic organs and upper abdominal exam was carried out with findings noted above. The lower anterior abdominal omental adhesion was lysed with the harmonic scalpel.  The left infundibulopelvic ligament and vessels were identified, the ureter identified away from the surgical site and the ligament and vessels were transected using the harmonic scalpel. The peritoneal reflection of the broad ligament was transected using the harmonic scalpel as was the round ligament on the left and the anterior vesicouterine peritoneal fold was incised using the Harmonic scalpel to the midline. A similar procedure was carried out on the other side meeting the anterior vesicouterine peritoneal incision in the midline. Attention was then turned to the vaginal portion of the procedure and the patient was placed in the high dorsal lithotomy position, the cervix visualized with a weighted speculum, grasped with a tenaculum and the cervical mucosa was circumferentially injected using 1% Xylocaine with 1:100,000 epinephrine dilution,10 cc total.The cervical mucosa was then circumferentially incised and the paracervical planes were sharply developed without difficulty. The posterior cul-de-sac was sharply entered and a long weighted speculum was placed. The right and left uterosacral ligament were identified clamped cut and ligated using 0 Vicryl suture and tagged for future reference. The anterior vesicouterine plane was sharply developed and the anterior cul-de-sac was ultimately entered without difficulty. The uterus was progressively freed from its attachments through clamping, cutting and ligating of the precervical/parametrial tissues using 0 Vicryl suture in interrupted 0-Vicryl stitch. The uterus was ultimately delivered through the vagina and the remaining tissue attachments were clamped cut and ligated using 0 Vicryl suture and the specimen delivered and sent  to pathology. The long weighted speculum was replaced with a shorter weighted speculum, the intestines packed from the cul-de-sac with a tagged  tail sponge and the pelvis was irrigated noting adequate hemostasis. The posterior vaginal cuff was run from uterosacral ligament to uterosacral ligament using 0 Vicryl suture in running interlocking stitch. The tagged tail sponge was removed and the vagina was then closed anterior to posterior using 0 Vicryl suture in interrupted figure-of-eight stitch. The Foley catheter was removed, the patient having previously received Pyridium preoperatively and cystoscopy was performed with no evidence of bladder mucosal injury and bilateral ureteral jets demonstrated. The Foley catheter was replaced. The patient was placed in the low dorsal lithotomy position, the surgical team regloved and regowned and the abdomen was reinsufflated. Examination of the pelvis showed adequate hemostasis.  The vaginal cuff as well as the infundibulopelvic pedicles were reinspected under a low pressure situation showing continued hemostasis. Prophylactic Surgicel was placed at the vaginal cuff.  The right and left 5 mm ports were removed showing adequate hemostasis and no evidence of hernia formation. The 10 mm port was then backed out under direct visualization showing adequate hemostasis and no evidence of hernia formation. All skin incisions were injected using 0.25% Marcaine, the infraumbilical incision was closed using 0 Vicryl suture in an interrupted subcutaneous /fascial stitch. The infraumbilical skin incision was reapproximated using 4-0 plain suture in a running subcuticular stitch and LiquiBand skin adhesive was applied. The 5 mm ports were closed with LiquiBand skin adhesive. The patient received intraoperative Toradol, was awakened without difficulty and was taken to the recovery room in good condition having tolerated procedure well.      [name], 9:45 AM  07/28/2015

## 2015-07-28 NOTE — Transfer of Care (Signed)
Immediate Anesthesia Transfer of Care Note  Patient: Rachel Branch  Procedure(s) Performed: Procedure(s): LAPAROSCOPIC ASSISTED VAGINAL HYSTERECTOMY (N/A) LAPAROSCOPIC BILATERAL SALPINGO OOPHORECTOMY (Bilateral) CYSTOSCOPY (N/A) LAPAROSCOPIC LYSIS OF ADHESIONS (N/A)  Patient Location: PACU  Anesthesia Type:General  Level of Consciousness: awake, alert  and oriented  Airway & Oxygen Therapy: Patient Spontanous Breathing and Patient connected to nasal cannula oxygen  Post-op Assessment: Report given to RN and Post -op Vital signs reviewed and stable  Post vital signs: Reviewed and stable  Last Vitals:  Filed Vitals:   07/28/15 0600  BP: 145/89  Pulse: 67  Temp: 36.3 C  Resp: 20    Complications: No apparent anesthesia complications

## 2015-07-29 ENCOUNTER — Encounter (HOSPITAL_COMMUNITY): Payer: Self-pay | Admitting: Gynecology

## 2015-07-29 DIAGNOSIS — N950 Postmenopausal bleeding: Secondary | ICD-10-CM | POA: Diagnosis not present

## 2015-07-29 MED ORDER — INFLUENZA VAC SPLIT QUAD 0.5 ML IM SUSY: 0.5000 mL | PREFILLED_SYRINGE | INTRAMUSCULAR | Status: DC

## 2015-07-29 MED ORDER — OXYCODONE-ACETAMINOPHEN 5-325 MG PO TABS: 1.0000 | ORAL_TABLET | ORAL | Status: DC | PRN

## 2015-07-29 NOTE — Progress Notes (Signed)
Rachel Branch 05/10/1956 [id number]   1 Day Post-Op Procedure(s) (LRB): LAPAROSCOPIC ASSISTED VAGINAL HYSTERECTOMY (N/A) LAPAROSCOPIC BILATERAL SALPINGO OOPHORECTOMY (Bilateral) CYSTOSCOPY (N/A) LAPAROSCOPIC LYSIS OF ADHESIONS (N/A)  Subjective: Patient reports feels well, no acute distress, pain severity reported mild, Yes.   taking PO, foley catheter out, Yes.   voiding, Yes.   ambulating, Yes.   passing flatus  Objective: Vital signs in last 24 hours: Temp:  [97.9 F (36.6 C)-98.8 F (37.1 C)] 97.9 F (36.6 C) (11/09 0547) Pulse Rate:  [54-94] 54 (11/09 0547) Resp:  [13-18] 18 (11/09 0547) BP: (93-141)/(52-83) 115/57 mmHg (11/09 0547) SpO2:  [93 %-100 %] 95 % (11/09 0547) Weight:  [235 lb (106.595 kg)] 235 lb (106.595 kg) (11/08 1150) Last BM Date: 07/27/15    EXAM General: awake, alert and no distress Resp: clear to auscultation bilaterally Cardio: regular rate and rhythm GI: soft, minimal tender, bowel sounds active, incisions dry intact Lower Extremities: Without swelling or tenderness Vaginal Bleeding: Reported scant   Assessment: s/p Procedure(s): LAPAROSCOPIC ASSISTED VAGINAL HYSTERECTOMY LAPAROSCOPIC BILATERAL SALPINGO OOPHORECTOMY CYSTOSCOPY LAPAROSCOPIC LYSIS OF ADHESIONS: progressing well, ready for discharge.    Plan: Discharge home today.  Precautions, instructions and follow up were discussed with the patient and husband.  Prescriptions provided per AVS.  Patient to call the office to arrange a post-operative appointmant in 2 weeks.    [name], 7:21 AM 07/29/2015

## 2015-07-29 NOTE — Progress Notes (Signed)
Pt discharged to home with husband.  Condition stable.  Discharge instructions reviewed with patient and her husband.  Pt ambulated to car with Astrid Divine, NT.  No equipment for home ordered at discharge.

## 2015-07-29 NOTE — Discharge Instructions (Signed)
°  Postoperative Instructions Hysterectomy ° °Dr. Finley Dinkel and the nursing staff have discussed postoperative instructions with you.  If you have any questions please ask them before you leave the hospital, or call Dr Mort Smelser’s office at [phone].   ° °We would like to emphasize the following instructions: ° ° °  Call the office to make your follow-up appointment as recommended by Dr Samhitha Rosen (usually 2 weeks). ° °  You were given a prescription, or one was ordered for you at the pharmacy you designated.  Get that prescription filled and take the medication according to instructions. ° °  You may eat a regular diet, but slowly until you start having bowel movements. ° °  Drink plenty of water daily. ° °  Nothing in the vagina (intercourse, douching, objects of any kind) until released by Dr Kimetha Trulson. ° °  No driving for two weeks.  Wait to be cleared by Dr Aarohi Redditt at your first post op check.  Car rides (short) are ok after several days at home, as long as you are not having significant pain, but no traveling out of town. ° °  You may shower, but no baths.  Walking up and down stairs is ok.  No heavy lifting, prolonged standing, repeated bending or any “working out” until your first post op check. ° °  Rest frequently, listen to your body and do not push yourself and overdo it. ° °  Call if: ° °o Your pain medication does not seem strong enough. °o Worsening pain or abdominal bloating °o Persistent nausea or vomiting °o Difficulty with urination or bowel movements. °o Temperature of 101 degrees or higher. °o Bleeding heavier then staining (clots or period type flow). °o Incisions become red, tender or begin to drain. °o You have any questions or concerns. °

## 2015-07-31 NOTE — Discharge Summary (Signed)
Rachel Branch 1956-07-18 [id number]   Discharge Summary  Date of Admission:  07/28/2015  Date of Discharge:  07/29/2015  Discharge Diagnosis:  Irregular bleeding, leiomyoma, adenomyosis  Procedure:  Procedure(s): LAPAROSCOPIC ASSISTED VAGINAL HYSTERECTOMY LAPAROSCOPIC BILATERAL SALPINGO OOPHORECTOMY CYSTOSCOPY LAPAROSCOPIC LYSIS OF ADHESIONS  Pathology: Uterus, ovaries and fallopian tubes, with cervix - CERVIX: ATROPHY. - ENDOMETRIUM: SECRETORY-TYPE ENDOMETRIUM WITH DECIDUALIZATION OF THE STROMA, CONSISTENT WITH HORMONE EFFECT. - MYOMETRIUM: ADENOMYOSIS. - LEIOMYOMA. - SEROSA: UNREMARKABLE. - RIGHT ADNEXA: BENIGN OVARY AND FALLOPIAN TUBE. - LEFT ADNEXA: [place] Course:  The patient underwent an uncomplicated LAVH BSO cystoscopy 07/28/2015. She was discharged on postoperative day #1 and doing well, tolerating a regular diet, voiding without difficulty with good pain relief on oral medication. The patient received instructions for postoperative care and call precautions.  She received prescriptions per AVS and will be seen in the office 2 weeks following discharge.       [name], 4:49 PM 07/31/2015

## 2015-08-05 ENCOUNTER — Telehealth: Payer: Self-pay | Admitting: *Deleted

## 2015-08-05 NOTE — Telephone Encounter (Signed)
Pt had surgery on 07/28/15 LAVH c/o itchy rash only where betadine was placed, pt said she is using benadryl cream only and no relief noticed rash on this past Saturday. Pt asked if you have any other recommendations? Please advise

## 2015-08-05 NOTE — Telephone Encounter (Signed)
I would start with OTC hydrocortisone cream 3-4 times daily. If persists over the next couple days call and I'll go with a stronger steroid cream.

## 2015-08-05 NOTE — Telephone Encounter (Signed)
Pt aware of the below note

## 2015-08-10 ENCOUNTER — Ambulatory Visit (INDEPENDENT_AMBULATORY_CARE_PROVIDER_SITE_OTHER): Payer: 59 | Admitting: Gynecology

## 2015-08-10 ENCOUNTER — Encounter: Payer: Self-pay | Admitting: Gynecology

## 2015-08-10 VITALS — BP 118/76

## 2015-08-10 DIAGNOSIS — Z9889 Other specified postprocedural states: Secondary | ICD-10-CM

## 2015-08-10 NOTE — Progress Notes (Signed)
Rachel Branch Aug 01, 1956 [id number]        59 y.o.  [id number] Presents for her two-week postoperative visit status post LAVH BSO. Doing well without complaints.  No menopausal symptoms.  Past medical history,surgical history, problem list, medications, allergies, family history and social history were all reviewed and documented in the EPIC chart.  Directed ROS with pertinent positives and negatives documented in the history of present illness/assessment and plan.  Exam: [name] Filed Vitals:   08/10/15 0849  BP: 118/76   General appearance:  Normal Abdomen soft nontender without masses guarding rebound. Incisions healed nicely. Pelvic external BUS vagina with cuff healing nicely. Bimanual without masses or tenderness  Assessment/Plan:  59 y.o. G4P0013 with normal 2 week postoperative visit status post LAVH BSO. Continue to slowly resume normal activities with the exception of continued pelvic rest. Follow up in 2 weeks for her next postoperative visit.    [name], 9:10 AM 08/10/2015

## 2015-08-10 NOTE — Patient Instructions (Signed)
Follow up in 2 weeks for next postoperative visit 

## 2015-08-25 ENCOUNTER — Encounter: Payer: Self-pay | Admitting: Gynecology

## 2015-08-25 ENCOUNTER — Ambulatory Visit (INDEPENDENT_AMBULATORY_CARE_PROVIDER_SITE_OTHER): Payer: 59 | Admitting: Gynecology

## 2015-08-25 VITALS — BP 130/80

## 2015-08-25 DIAGNOSIS — R946 Abnormal results of thyroid function studies: Secondary | ICD-10-CM

## 2015-08-25 DIAGNOSIS — E038 Other specified hypothyroidism: Secondary | ICD-10-CM

## 2015-08-25 DIAGNOSIS — Z9889 Other specified postprocedural states: Secondary | ICD-10-CM

## 2015-08-25 DIAGNOSIS — R7989 Other specified abnormal findings of blood chemistry: Secondary | ICD-10-CM

## 2015-08-25 LAB — TSH: TSH: 2.089 u[IU]/mL (ref 0.350–4.500)

## 2015-08-25 NOTE — Progress Notes (Signed)
Rachel Branch October 02, 1955 [id number]        59 y.o.  G4P0013 Presents for her 1 month postop visit status post LAVH BSO. Doing well without complaints.  Past medical history,surgical history, problem list, medications, allergies, family history and social history were all reviewed and documented in the EPIC chart.  Directed ROS with pertinent positives and negatives documented in the history of present illness/assessment and plan.  Exam: Rachel Branch assistant Filed Vitals:   08/25/15 0953  BP: 130/80   General appearance:  Normal Abdomen soft nontender without masses guarding rebound. Incisions healed nicely. Pelvic external BUS vagina with suture line intact. Bimanual without masses or tenderness  Assessment/Plan:  59 y.o. G4P0013 with normal four-week postoperative visit status post LAVH BSO.  Slowly resume all normal activities with the exception of continued pelvic rest. Follow up in 2 weeks for her final postoperative visit    [name], 10:23 AM 08/25/2015

## 2015-08-25 NOTE — Patient Instructions (Signed)
Follow up in 2 weeks for next postoperative visit slowly resume all activities with the exception of continued nothing in the vagina

## 2015-09-09 ENCOUNTER — Ambulatory Visit (INDEPENDENT_AMBULATORY_CARE_PROVIDER_SITE_OTHER): Payer: 59 | Admitting: Gynecology

## 2015-09-09 ENCOUNTER — Encounter: Payer: Self-pay | Admitting: Gynecology

## 2015-09-09 VITALS — BP 122/80

## 2015-09-09 DIAGNOSIS — Z9889 Other specified postprocedural states: Secondary | ICD-10-CM

## 2015-09-09 NOTE — Patient Instructions (Signed)
Follow up in 3-4 months for your annual exam.

## 2015-09-09 NOTE — Progress Notes (Signed)
Rachel Branch 1956/05/10 [id number]        59 y.o.  [name] presents for her postoperative visit status post LAVH BSO. Doing well without complaints  Past medical history,surgical history, problem list, medications, allergies, family history and social history were all reviewed and documented in the EPIC chart.  Directed ROS with pertinent positives and negatives documented in the history of present illness/assessment and plan.  Exam: Rachel Branch assistant Filed Vitals:   09/09/15 1046  BP: 122/80   General appearance:  Normal Abdomen soft nontender without masses guarding rebound. Incisions well healed. Pelvic external BUS vagina with cuff well healed. Bimanual without masses or tenderness  Assessment/Plan:  59 y.o. [id number] with normal postoperative visit status post LAVH BSO. We'll slowly resume all normal activities. Follow up in 3-4 months for annual exam she is due now. Sooner if any issues    Rachel Auerbach MD, 10:55 AM 09/09/2015

## 2015-10-10 ENCOUNTER — Other Ambulatory Visit: Payer: Self-pay | Admitting: Gynecology

## 2015-10-12 NOTE — Telephone Encounter (Signed)
Annual scheduled on 12/03/15

## 2015-11-10 ENCOUNTER — Other Ambulatory Visit: Payer: Self-pay | Admitting: Gynecology

## 2015-12-03 ENCOUNTER — Encounter: Payer: Self-pay | Admitting: Gynecology

## 2015-12-03 ENCOUNTER — Ambulatory Visit (INDEPENDENT_AMBULATORY_CARE_PROVIDER_SITE_OTHER): Payer: 59 | Admitting: Gynecology

## 2015-12-03 ENCOUNTER — Encounter: Payer: 59 | Admitting: Gynecology

## 2015-12-03 VITALS — BP 124/76 | Ht 65.0 in | Wt 239.0 lb

## 2015-12-03 DIAGNOSIS — Z01419 Encounter for gynecological examination (general) (routine) without abnormal findings: Secondary | ICD-10-CM | POA: Diagnosis not present

## 2015-12-03 DIAGNOSIS — Z1329 Encounter for screening for other suspected endocrine disorder: Secondary | ICD-10-CM

## 2015-12-03 DIAGNOSIS — Z1321 Encounter for screening for nutritional disorder: Secondary | ICD-10-CM

## 2015-12-03 DIAGNOSIS — Z13220 Encounter for screening for lipoid disorders: Secondary | ICD-10-CM

## 2015-12-03 LAB — CBC WITH DIFFERENTIAL/PLATELET
BASOS ABS: 0.1 10*3/uL (ref 0.0–0.1)
Basophils Relative: 1 % (ref 0–1)
EOS ABS: 0.2 10*3/uL (ref 0.0–0.7)
EOS PCT: 2 % (ref 0–5)
HEMATOCRIT: 40.3 % (ref 36.0–46.0)
Hemoglobin: 13.5 g/dL (ref 12.0–15.0)
LYMPHS PCT: 22 % (ref 12–46)
Lymphs Abs: 2.1 10*3/uL (ref 0.7–4.0)
MCH: 28.8 pg (ref 26.0–34.0)
MCHC: 33.5 g/dL (ref 30.0–36.0)
MCV: 86.1 fL (ref 78.0–100.0)
MONO ABS: 1.1 10*3/uL — AB (ref 0.1–1.0)
MPV: 10 fL (ref 8.6–12.4)
Monocytes Relative: 11 % (ref 3–12)
Neutro Abs: 6.2 10*3/uL (ref 1.7–7.7)
Neutrophils Relative %: 64 % (ref 43–77)
PLATELETS: 354 10*3/uL (ref 150–400)
RBC: 4.68 MIL/uL (ref 3.87–5.11)
RDW: 14.5 % (ref 11.5–15.5)
WBC: 9.7 10*3/uL (ref 4.0–10.5)

## 2015-12-03 LAB — COMPREHENSIVE METABOLIC PANEL
ALK PHOS: 151 U/L — AB (ref 33–130)
ALT: 117 U/L — ABNORMAL HIGH (ref 6–29)
AST: 62 U/L — ABNORMAL HIGH (ref 10–35)
Albumin: 3.8 g/dL (ref 3.6–5.1)
BUN: 12 mg/dL (ref 7–25)
CALCIUM: 9.1 mg/dL (ref 8.6–10.4)
CHLORIDE: 102 mmol/L (ref 98–110)
CO2: 28 mmol/L (ref 20–31)
Creat: 0.9 mg/dL (ref 0.50–1.05)
GLUCOSE: 90 mg/dL (ref 65–99)
POTASSIUM: 4.6 mmol/L (ref 3.5–5.3)
Sodium: 140 mmol/L (ref 135–146)
Total Bilirubin: 0.4 mg/dL (ref 0.2–1.2)
Total Protein: 7 g/dL (ref 6.1–8.1)

## 2015-12-03 LAB — LIPID PANEL
CHOL/HDL RATIO: 4.2 ratio (ref <=5.0)
Cholesterol: 241 mg/dL — ABNORMAL HIGH (ref 125–200)
HDL: 58 mg/dL (ref >=46)
LDL Cholesterol: 159 mg/dL — ABNORMAL HIGH (ref <130)
Triglycerides: 119 mg/dL (ref <150)
VLDL: 24 mg/dL (ref <30)

## 2015-12-03 LAB — TSH: TSH: 0.87 m[IU]/L

## 2015-12-03 NOTE — Patient Instructions (Signed)
Follow up with the gastroenterologist in reference to your elevated liver functions and for screening colonoscopy.  Schedule and follow up for your mammogram    Follow up for your annual GYN exam in 1 year.

## 2015-12-03 NOTE — Progress Notes (Signed)
    Rachel Branch January 02, 1956 [id number]        60 y.o.  G4P0013  for annual exam.  Several issues noted below.  Past medical history,surgical history, problem list, medications, allergies, family history and social history were all reviewed and documented as reviewed in the EPIC chart.  ROS:  Performed with pertinent positives and negatives included in the history, assessment and plan.   Additional significant findings :  none   Exam: [name] assistant Filed Vitals:   12/03/15 1030  BP: 124/76  Height: 5\' 5"  (1.651 m)  Weight: 239 lb (108.41 kg)   General appearance:  Normal affect, orientation and appearance. Skin: Grossly normal HEENT: Without gross lesions.  No cervical or supraclavicular adenopathy. Thyroid normal.  Lungs:  Clear without wheezing, rales or rhonchi Cardiac: RR, without RMG Abdominal:  Soft, nontender, without masses, guarding, rebound, organomegaly or hernia Breasts:  Examined lying and sitting without masses, retractions, discharge or axillary adenopathy. Pelvic:  Ext/BUS/vagina with atrophic changes  Adnexa without masses or tenderness    Anus and perineum normal   Rectovaginal normal sphincter tone without palpated masses or tenderness.    Assessment/Plan:  60 y.o. G4P0013 female for annual exam.   1. Status post LAVH BSO 07/2015 for irregular bleeding and pelvic adhesions. Has done well without significant flushes or night sweats. Continue to monitor report any issues. 2. Pap smear 08/2013. No Pap smear done today. No history of significant abnormal Pap smears. Options to stop screening versus less frequent screening intervals based on current screening guidelines and hysterectomy history reviewed. Will readdress on an annual basis. 3. Mammography overdo and I reminded patient to schedule and she agrees to call and schedule. SBE monthly reviewed. 4. Colonoscopy 10 years ago. Patient does have a history of elevated LFTs that her primary physician as  recommended gastroenterology follow up. We'll recheck her LFTs today and strongly recommended patient call and make an appointment to see her gastroenterologist not only in follow up of this but also to arrange for a screening colonoscopy and she agrees to do so. 5. DEXA 2013 normal. Plan repeat next year at 5 year interval. Check vitamin D level today. 6. Health maintenance. Patient requests baseline labs.  CBC, CMP, lipid profile, vitamin D, TSH and urinalysis ordered.  Follow up for mammogram and with gastroenterologist as noted above. Follow up for GYN exam in 1 year.   [name], 10:51 AM 12/03/2015

## 2015-12-04 ENCOUNTER — Other Ambulatory Visit: Payer: Self-pay | Admitting: Gynecology

## 2015-12-04 DIAGNOSIS — E559 Vitamin D deficiency, unspecified: Secondary | ICD-10-CM

## 2015-12-04 LAB — URINALYSIS W MICROSCOPIC + REFLEX CULTURE
BACTERIA UA: NONE SEEN [HPF]
BILIRUBIN URINE: NEGATIVE
Casts: NONE SEEN [LPF]
Glucose, UA: NEGATIVE
Ketones, ur: NEGATIVE
Leukocytes, UA: NEGATIVE
Nitrite: NEGATIVE
PROTEIN: NEGATIVE
Specific Gravity, Urine: 1.024 (ref 1.001–1.035)
WBC UA: NONE SEEN WBC/HPF (ref <=5)
Yeast: NONE SEEN [HPF]
pH: 5.5 (ref 5.0–8.0)

## 2015-12-04 LAB — VITAMIN D 25 HYDROXY (VIT D DEFICIENCY, FRACTURES): VIT D 25 HYDROXY: 17 ng/mL — AB (ref 30–100)

## 2015-12-04 MED ORDER — VITAMIN D (ERGOCALCIFEROL) 1.25 MG (50000 UNIT) PO CAPS: 50000.0000 [IU] | ORAL_CAPSULE | ORAL | Status: DC

## 2015-12-05 LAB — URINE CULTURE: Colony Count: 30000

## 2015-12-08 ENCOUNTER — Other Ambulatory Visit: Payer: Self-pay | Admitting: Gynecology

## 2015-12-14 ENCOUNTER — Encounter: Payer: Self-pay | Admitting: Gastroenterology

## 2015-12-29 ENCOUNTER — Other Ambulatory Visit: Payer: Self-pay | Admitting: Gynecology

## 2015-12-29 DIAGNOSIS — Z1231 Encounter for screening mammogram for malignant neoplasm of breast: Secondary | ICD-10-CM

## 2016-01-06 ENCOUNTER — Ambulatory Visit (HOSPITAL_COMMUNITY)
Admission: RE | Admit: 2016-01-06 | Discharge: 2016-01-06 | Disposition: A | Discharge status: Home / Self Care | Payer: 59 | Source: Ambulatory Visit | Attending: Gynecology | Admitting: Gynecology

## 2016-01-06 DIAGNOSIS — Z1231 Encounter for screening mammogram for malignant neoplasm of breast: Secondary | ICD-10-CM | POA: Diagnosis not present

## 2016-01-07 ENCOUNTER — Ambulatory Visit (INDEPENDENT_AMBULATORY_CARE_PROVIDER_SITE_OTHER): Payer: 59 | Admitting: Gastroenterology

## 2016-01-07 ENCOUNTER — Encounter: Payer: Self-pay | Admitting: Gastroenterology

## 2016-01-07 VITALS — BP 130/82 | HR 73 | Temp 97.5°F | Ht 65.0 in | Wt 240.4 lb

## 2016-01-07 DIAGNOSIS — R1011 Right upper quadrant pain: Secondary | ICD-10-CM | POA: Diagnosis not present

## 2016-01-07 DIAGNOSIS — R7989 Other specified abnormal findings of blood chemistry: Secondary | ICD-10-CM

## 2016-01-07 DIAGNOSIS — K8010 Calculus of gallbladder with chronic cholecystitis without obstruction: Secondary | ICD-10-CM | POA: Insufficient documentation

## 2016-01-07 DIAGNOSIS — R945 Abnormal results of liver function studies: Secondary | ICD-10-CM

## 2016-01-07 NOTE — Patient Instructions (Signed)
   Please have your labs done.  Instructions for fatty liver: Recommend 1-2# weight loss per week until ideal body weight through exercise & diet. Low fat/cholesterol diet.   Avoid sweets, sodas, fruit juices, sweetened beverages like tea, etc. Gradually increase exercise from 15 min daily up to 1 hr per day 5 days/week. Limit alcohol use.   Nonalcoholic Fatty Liver Disease Diet Nonalcoholic fatty liver disease is a condition that causes fat to accumulate in and around the liver. The disease makes it harder for the liver to work the way that it should. Following a healthy diet can help to keep nonalcoholic fatty liver disease under control. It can also help to prevent or improve conditions that are associated with the disease, such as heart disease, diabetes, high blood pressure, and abnormal cholesterol levels. Along with regular exercise, this diet:  Promotes weight loss.  Helps to control blood sugar levels.  Helps to improve the way that the body uses insulin. WHAT DO I NEED TO KNOW ABOUT THIS DIET?  Use the glycemic index (GI) to plan your meals. The index tells you how quickly a food will raise your blood sugar. Choose low-GI foods. These foods take a longer time to raise blood sugar.  Keep track of how many calories you take in. Eating the right amount of calories will help you to achieve a healthy weight.  You may want to follow a Mediterranean diet. This diet includes a lot of vegetables, lean meats or fish, whole grains, fruits, and healthy oils and fats. WHAT FOODS CAN I EAT? Grains Whole grains, such as whole-wheat or whole-grain breads, crackers, tortillas, cereals, and pasta. Stone-ground whole wheat. Pumpernickel bread. Unsweetened oatmeal. Bulgur. Barley. Quinoa. Brown or wild rice. Corn or whole-wheat flour tortillas. Vegetables Lettuce. Spinach. Peas. Beets. Cauliflower. Cabbage. Broccoli. Carrots. Tomatoes. Squash. Eggplant. Herbs. Peppers. Onions. Cucumbers. Brussels  sprouts. Yams and sweet potatoes. Beans. Lentils. Fruits Bananas. Apples. Oranges. Grapes. Papaya. Mango. Pomegranate. Kiwi. Grapefruit. Cherries. Meats and Other Protein Sources Seafood and shellfish. Lean meats. Poultry. Tofu. Dairy Low-fat or fat-free dairy products, such as yogurt, cottage cheese, and cheese. Beverages Water. Sugar-free drinks. Tea. Coffee. Low-fat or skim milk. Milk alternatives, such as soy or almond milk. Real fruit juice. Condiments Mustard. Relish. Low-fat, low-sugar ketchup and barbecue sauce. Low-fat or fat-free mayonnaise. Sweets and Desserts Sugar-free sweets. Fats and Oils Avocado. Canola or olive oil. Nuts and nut butters. Seeds. The items listed above may not be a complete list of recommended foods or beverages. Contact your dietitian for more options.  WHAT FOODS ARE NOT RECOMMENDED? Palm oil and coconut oil. Processed foods. Fried foods. Sweetened drinks, such as sweet tea, milkshakes, snow cones, iced sweet drinks, and sodas. Alcohol. Sweets. Foods that contain a lot of salt or sodium. The items listed above may not be a complete list of foods and beverages to avoid. Contact your dietitian for more information.   This information is not intended to replace advice given to you by your health care provider. Make sure you discuss any questions you have with your health care provider.   Document Released: 01/20/2015 Document Reviewed: 01/20/2015 Elsevier Interactive Patient Education [employer].

## 2016-01-07 NOTE — Progress Notes (Addendum)
REVIEWED-NO ADDITIONAL RECOMMENDATIONS.  Primary Care Physician:  Alonza Bogus, MD  Primary Gastroenterologist:  Barney Drain, MD   Chief Complaint  Patient presents with  . Abdominal Pain  . OTHER    elevated liver tests    HPI:  Rachel Branch is a 60 y.o. female here Further evaluation of abdominal pain and abnormal LFTs at the request of Dr. Luan Pulling.  Patient reports she has couple year history abnormal LFTs. See below for my results. Also with history of crampy right upper quadrant pain related to certain foods and if she drinks a lot of coffee. Will last for a few seconds up to several minutes. No vomiting. Sometimes utilizes Zegerid and feels like it does help. Bowel movements are regular. No blood in the stool or melena. She denies previous colonoscopy and states she does not want have one either. She reports that she was checked for hepatitis B and C and was negative. She has a history of claustrophobia. Patient denies typical heartburn symptoms.  Abdominal ultrasound in November 2016 with layering gallbladder sludge, fatty liver. HIDA scan back in 2014 with gallbladder ejection fraction of 15.8%. Patient states she had a very difficult time with this test due to her claustrophobia and will never undergo it again.   Current Outpatient Prescriptions  Medication Sig Dispense Refill  . cetirizine (ZYRTEC) 10 MG tablet Take 10 mg by mouth daily.    Marland Kitchen levothyroxine (SYNTHROID, LEVOTHROID) 112 MCG tablet TAKE 1 TABLET(112 MCG) BY MOUTH DAILY BEFORE BREAKFAST 30 tablet 12  . loperamide (IMODIUM) 2 MG capsule Take by mouth as needed for diarrhea or loose stools.    . Simethicone (GAS-X PO) Take 1 capsule by mouth as needed (gas).     Marland Kitchen VITAMIN B1-B12 IM Inject 1 mL into the muscle. Inject twice a month    . Vitamin D, Ergocalciferol, (DRISDOL) 50000 units CAPS capsule Take 1 capsule (50,000 Units total) by mouth every 7 (seven) days. 12 capsule 0   No current  facility-administered medications for this visit.    Allergies as of 01/07/2016 - Review Complete 01/07/2016  Allergen Reaction Noted  . Latex Rash 04/01/2011    Past Medical History  Diagnosis Date  . IBS (irritable bowel syndrome)   . Hypothyroidism   . Pernicious anemia     B12 injections every other week  . Simple endometrial hyperplasia without atypia 08/2013    On hysteroscopic polyp resection pathology    Past Surgical History  Procedure Laterality Date  . Dilation and curettage of uterus  2007  . Tubal ligation  1997  . Combined hysteroscopy diagnostic / d&c  03/14/11    benign endometrial polyps  . Dilatation & curettage/hysteroscopy with trueclear N/A 09/18/2013    Procedure: DILATATION & CURETTAGE/HYSTEROSCOPY WITH TRUCLEAR;  Surgeon: Anastasio Auerbach, MD;  Location: [facility];  Service: Gynecology;  Laterality: N/A;  . Dilatation & curettage/hysteroscopy with myosure N/A 12/02/2014    Procedure: DILATATION & CURETTAGE/HYSTEROSCOPY WITH MYOSURE;  Surgeon: Anastasio Auerbach, MD;  Location: [facility];  Service: Gynecology;  Laterality: N/A;  . Laparoscopic assisted vaginal hysterectomy N/A 07/28/2015    Procedure: LAPAROSCOPIC ASSISTED VAGINAL HYSTERECTOMY;  Surgeon: Anastasio Auerbach, MD;  Location: [facility];  Service: Gynecology;  Laterality: N/A;  . Laparoscopic bilateral salpingo oopherectomy Bilateral 07/28/2015    Procedure: LAPAROSCOPIC BILATERAL SALPINGO OOPHORECTOMY;  Surgeon: Anastasio Auerbach, MD;  Location: [facility];  Service: Gynecology;  Laterality: Bilateral;  . Cystoscopy N/A 07/28/2015    Procedure: CYSTOSCOPY;  Surgeon: Anastasio Auerbach, MD;  Location: [facility];  Service: Gynecology;  Laterality: N/A;  . Laparoscopic lysis of adhesions N/A 07/28/2015    Procedure: LAPAROSCOPIC LYSIS OF ADHESIONS;  Surgeon: Anastasio Auerbach, MD;  Location: [facility];  Service: Gynecology;  Laterality: N/A;    Family History  Problem Relation Age of Onset  . Diabetes Mother   .  Hypertension Mother   . Heart disease Mother   . Hypertension Father   . Diabetes Brother   . Cancer Brother     prostate  . Colon cancer Neg Hx     Social History   Social History  . Marital Status: Married    Spouse Name: N/A  . Number of Children: 3  . Years of Education: N/A   Occupational History  . Not on file.   Social History Main Topics  . Smoking status: Never Smoker   . Smokeless tobacco: Never Used     Comment: Never smoked  . Alcohol Use: No  . Drug Use: No  . Sexual Activity:    Partners: Male     Comment: 1st intercourse 61 yo-1 partner   Other Topics Concern  . Not on file   Social History Narrative      ROS:  General: Negative for anorexia, weight loss, fever, chills, fatigue, weakness. Eyes: Negative for vision changes.  ENT: Negative for hoarseness, difficulty swallowing , nasal congestion. CV: Negative for chest pain, angina, palpitations, dyspnea on exertion, peripheral edema.  Respiratory: Negative for dyspnea at rest, dyspnea on exertion, cough, sputum, wheezing.  GI: See history of present illness. GU:  Negative for dysuria, hematuria, urinary incontinence, urinary frequency, nocturnal urination.  MS: Negative for joint pain, low back pain.  Derm: Negative for rash or itching.  Neuro: Negative for weakness, abnormal sensation, seizure, frequent headaches, memory loss, confusion.  Psych: Negative for anxiety, depression, suicidal ideation, hallucinations.  Endo: Negative for unusual weight change.  Heme: Negative for bruising or bleeding. Allergy: Negative for rash or hives.    Physical Examination:  BP 130/82 mmHg  Pulse 73  Temp(Src) 97.5 F (36.4 C) (Oral)  Ht 5\' 5"  (1.651 m)  Wt 240 lb 6.4 oz (109.045 kg)  BMI 40.00 kg/m2  LMP 02/18/2011   General: Well-nourished, well-developed in no acute distress.  Head: Normocephalic, atraumatic.   Eyes: Conjunctiva pink, no icterus. Mouth: Oropharyngeal mucosa moist and pink , no  lesions erythema or exudate. Neck: Supple without thyromegaly, masses, or lymphadenopathy.  Lungs: Clear to auscultation bilaterally.  Heart: Regular rate and rhythm, no murmurs rubs or gallops.  Abdomen: Bowel sounds are normal, nontender, nondistended, no hepatosplenomegaly or masses, no abdominal bruits or    hernia , no rebound or guarding.   Rectal: not performed Extremities: No lower extremity edema. No clubbing or deformities.  Neuro: Alert and oriented x 4 , grossly normal neurologically.  Skin: Warm and dry, no rash or jaundice.   Psych: Alert and cooperative, normal mood and affect.  Labs: Lab Results  Component Value Date   WBC 9.7 12/03/2015   HGB 13.5 12/03/2015   HCT 40.3 12/03/2015   MCV 86.1 12/03/2015   PLT 354 12/03/2015   Lab Results  Component Value Date   CREATININE 0.90 12/03/2015   BUN 12 12/03/2015   NA 140 12/03/2015   K 4.6 12/03/2015   CL 102 12/03/2015   CO2 28 12/03/2015   Lab Results  Component Value Date   ALT 117* 12/03/2015   AST 62*  12/03/2015   ALKPHOS 151* 12/03/2015   BILITOT 0.4 12/03/2015     Imaging Studies: Mm Digital Screening Bilateral  01/07/2016  CLINICAL DATA:  Screening. EXAM: DIGITAL SCREENING BILATERAL MAMMOGRAM WITH CAD COMPARISON:  Previous exam(s). ACR Breast Density Category a: The breast tissue is almost entirely fatty. FINDINGS: There are no findings suspicious for malignancy. Images were processed with CAD. IMPRESSION: No mammographic evidence of malignancy. A result letter of this screening mammogram will be mailed directly to the patient. RECOMMENDATION: Screening mammogram in one year. (Code:SM-B-01Y) BI-RADS CATEGORY  1: Negative. Electronically Signed   By: Nolon Nations M.D.   On: 01/07/2016 12:43

## 2016-01-08 LAB — IRON AND TIBC
%SAT: 9 % — ABNORMAL LOW (ref 11–50)
IRON: 29 ug/dL — AB (ref 45–160)
TIBC: 336 ug/dL (ref 250–450)
UIBC: 307 ug/dL (ref 125–400)

## 2016-01-08 LAB — HEPATIC FUNCTION PANEL
ALK PHOS: 150 U/L — AB (ref 33–130)
ALT: 81 U/L — ABNORMAL HIGH (ref 6–29)
AST: 47 U/L — ABNORMAL HIGH (ref 10–35)
Albumin: 3.5 g/dL — ABNORMAL LOW (ref 3.6–5.1)
BILIRUBIN DIRECT: 0.1 mg/dL (ref <=0.2)
BILIRUBIN INDIRECT: 0.3 mg/dL (ref 0.2–1.2)
BILIRUBIN TOTAL: 0.4 mg/dL (ref 0.2–1.2)
Total Protein: 6.4 g/dL (ref 6.1–8.1)

## 2016-01-08 LAB — ANA: ANA: NEGATIVE

## 2016-01-08 LAB — IGG, IGA, IGM
IGA: 296 mg/dL (ref 69–380)
IGM, SERUM: 58 mg/dL (ref 52–322)
IgG (Immunoglobin G), Serum: 1110 mg/dL (ref 690–1700)

## 2016-01-08 LAB — FERRITIN: Ferritin: 15 ng/mL (ref 10–232)

## 2016-01-08 LAB — IGA: IGA: 296 mg/dL (ref 69–380)

## 2016-01-11 LAB — TISSUE TRANSGLUTAMINASE, IGA: TISSUE TRANSGLUTAMINASE AB, IGA: 1 U/mL (ref <4)

## 2016-01-11 LAB — MITOCHONDRIAL/SMOOTH MUSCLE AB PNL
Mitochondrial M2 Ab, IgG: <=20 Units (ref <=20.0)
Smooth Muscle Ab: <20 U (ref <20)

## 2016-01-11 LAB — CERULOPLASMIN: CERULOPLASMIN: 33 mg/dL (ref 18–53)

## 2016-01-11 NOTE — Assessment & Plan Note (Signed)
60 year old female with couple year history of abnormal LFTs which have been stable. Ultrasound with hepatic steatosis. Suspect abnormalities related to fatty liver. Ultrasound last fall with gallbladder sludge, previous HIDA scan in 2014 with biliary dyskinesia. Recent right upper quadrant pain possibly biliary in etiology. Cannot exclude need for elective cholecystectomy. Coming future if she continues to have right upper quadrant pain. Discussed at length with patient and spouse. Workup of abnormal LFTs planned with blood work. Discussed fatty liver, handout provided. Further recommendations to follow.  Please note we did discuss possible colonoscopy for screening purposes at some point the patient refuses.

## 2016-01-11 NOTE — Progress Notes (Signed)
CC'ED TO PCP 

## 2016-01-13 ENCOUNTER — Encounter: Payer: Self-pay | Admitting: Gastroenterology

## 2016-01-13 ENCOUNTER — Other Ambulatory Visit: Payer: Self-pay

## 2016-01-13 DIAGNOSIS — R945 Abnormal results of liver function studies: Principal | ICD-10-CM

## 2016-01-13 DIAGNOSIS — R7989 Other specified abnormal findings of blood chemistry: Secondary | ICD-10-CM

## 2016-01-13 NOTE — Progress Notes (Signed)
Quick Note:  Patient's ferritin is low normal, iron and iron saturation slightly low. CBC last month with normal hemoglobin. She has a history of pernicious anemia followed by hematology. LFTs stable. Screen negative for autoimmune hepatitis, PBC, Wilson's disease, hemachromatosis. Suspect we are dealing with fatty liver.  REVIEWED LABS FROM PCP: Hepatitis B surface antigen, hepatitis C antibody, hepatitis B core antibody, IgM, hepatitis A IgM all nonreactive  Instructions for fatty liver: Recommend 1-2# weight loss per week until ideal body weight through exercise & diet. Low fat/cholesterol diet.  Avoid sweets, sodas, fruit juices, sweetened beverages like tea, etc. Gradually increase exercise from 15 min daily up to 1 hr per day 5 days/week. Limit alcohol use.  Repeat LFTs, iron/TIBC, ferritin in 3 months. IF SHE HAS INCREASING RUQ PAIN WE MAY NEED TO CONSIDER GB SURGERY. Would recommend PPI daily (we can call in rx if needed, she may have zegerid already) for 2 months. To see if prevents ruq pain. Return to the office in 3 months. ______

## 2016-01-13 NOTE — Progress Notes (Signed)
Quick Note:  Pt is aware of results and recommendations. She would like the Rx called to [facility] in [place].  She said the OTC is kind of expensive. Sending to [place] to send to the pharmacy and routing to [place] to schedule ov appt. Lab orders on file for the end of July 2017. ______

## 2016-01-13 NOTE — Progress Notes (Signed)
APPT MADE AND LETTER SENT  °

## 2016-01-14 ENCOUNTER — Other Ambulatory Visit: Payer: Self-pay | Admitting: Gastroenterology

## 2016-01-14 MED ORDER — OMEPRAZOLE-SODIUM BICARBONATE 40-1100 MG PO CAPS: 1.0000 | ORAL_CAPSULE | Freq: Every day | ORAL | Status: DC

## 2016-01-14 NOTE — Progress Notes (Signed)
Quick Note:  Please let her know if RX for Zegerid is too expensive let us know. I could not tell if it is covered by her insurance. ______

## 2016-01-14 NOTE — Progress Notes (Signed)
Patient called in and stated she is on her way to pick up her Rx from [facility]

## 2016-01-15 ENCOUNTER — Other Ambulatory Visit: Payer: Self-pay

## 2016-01-18 NOTE — Progress Notes (Signed)
Quick Note:  PT said it is NOT covered by the insurance and the pharmacy sent something over for [place] regarding changing her prescription.  Please advise! ______

## 2016-02-04 ENCOUNTER — Other Ambulatory Visit: Payer: 59

## 2016-02-04 DIAGNOSIS — E559 Vitamin D deficiency, unspecified: Secondary | ICD-10-CM

## 2016-02-05 ENCOUNTER — Other Ambulatory Visit: Payer: Self-pay | Admitting: Gynecology

## 2016-02-05 ENCOUNTER — Telehealth: Payer: Self-pay

## 2016-02-05 DIAGNOSIS — E559 Vitamin D deficiency, unspecified: Secondary | ICD-10-CM

## 2016-02-05 LAB — VITAMIN D 25 HYDROXY (VIT D DEFICIENCY, FRACTURES): Vit D, 25-Hydroxy: 21 ng/mL — ABNORMAL LOW (ref 30–100)

## 2016-02-05 MED ORDER — VITAMIN D (ERGOCALCIFEROL) 1.25 MG (50000 UNIT) PO CAPS: 50000.0000 [IU] | ORAL_CAPSULE | ORAL | Status: DC

## 2016-02-05 NOTE — Progress Notes (Signed)
Quick Note:  I could not tell that PPI issues has been resolved. Please find out. ______

## 2016-02-05 NOTE — Telephone Encounter (Signed)
I spoke with patient about results.  She said she did take the Vit D 50000 one weekly. Her ins only allowed her #4 (mos supply) at a time and the last time she filled it it said "no refills" on the bottle. However, her last VIT D level was checked 2 mos ago.  I told her what you recommended about twice weekly VIT D 50000. She wanted me to check with you. She said while she is taking Vit D, even the OTC one you had her take several years ago, she hurts in her joints. She said most of the pain is in her knees and elbows, especially her right elbow. She said she's been off it a week and it is easing off. She cannot imagine taking it twice a week. She said she was so relieved to be through with Rx.

## 2016-02-05 NOTE — Telephone Encounter (Signed)
Then I would recommend going back on the 50,000 units weekly and prescribe that for 3 month refills and recheck a vitamin D level at that time. She needs to take it for the full 3 months

## 2016-02-05 NOTE — Telephone Encounter (Signed)
Patient advised. Rx sent. Lab order placed.

## 2016-02-05 NOTE — Telephone Encounter (Signed)
-----   Message from Anastasio Auerbach, MD sent at 02/05/2016  8:18 AM EDT ----- Tell patient her vitamin D level is still low. Check to see if she was taking her vitamin D 50,000 units weekly as prescribed. If she was not then she needs to do this and recheck her vitamin D level in 2-3 months. If she was taking the vitamin D then she needs to increase to vitamin D 50,000 units twice weekly 3 months and recheck her level then

## 2016-02-09 NOTE — Progress Notes (Signed)
Quick Note:  PT said [facility] said they had faxed info to [place]. Per Almyra Free, she has not received anything. I asked pt to call [facility] and have refax and she said she would.  She said she has just been taking OTC nexium when she needs something. ______

## 2016-02-09 NOTE — Progress Notes (Signed)
Quick Note:  Pt called back and said she called [facility] and they will refax the information.She said they told her it was not covered but that we could do some paper work to fix it. She said the Zegerid works so well, she just does not want to try anything else. ______

## 2016-02-18 ENCOUNTER — Telehealth: Payer: Self-pay

## 2016-02-18 NOTE — Telephone Encounter (Signed)
Tried to do a PA for zegerid. Per her insurance, they will not cover zegerid at all. No PA or appeal can be done.

## 2016-02-18 NOTE — Telephone Encounter (Signed)
What PPIs are covered?

## 2016-02-19 MED ORDER — OMEPRAZOLE 20 MG PO CPDR: 20.0000 mg | DELAYED_RELEASE_CAPSULE | Freq: Every day | ORAL | Status: DC

## 2016-02-19 NOTE — Telephone Encounter (Signed)
Per [employer] rx formulary:  Esomeprazole tier 1 Lansoprazole tier 1 Omeprazole tier 1 Pantoprazole tier 1

## 2016-02-19 NOTE — Telephone Encounter (Signed)
RX for omeprazole sent, very similar to zegerid.please let patietn know.

## 2016-02-22 ENCOUNTER — Other Ambulatory Visit: Payer: Self-pay

## 2016-02-22 DIAGNOSIS — R945 Abnormal results of liver function studies: Principal | ICD-10-CM

## 2016-02-22 DIAGNOSIS — R7989 Other specified abnormal findings of blood chemistry: Secondary | ICD-10-CM

## 2016-02-22 NOTE — Telephone Encounter (Signed)
Message sent via my chart

## 2016-04-11 LAB — IRON AND TIBC
%SAT: 12 % (ref 11–50)
IRON: 36 ug/dL — AB (ref 45–160)
TIBC: 307 ug/dL (ref 250–450)
UIBC: 271 ug/dL (ref 125–400)

## 2016-04-11 LAB — HEPATIC FUNCTION PANEL
ALK PHOS: 143 U/L — AB (ref 33–130)
ALT: 76 U/L — ABNORMAL HIGH (ref 6–29)
AST: 45 U/L — AB (ref 10–35)
Albumin: 3.5 g/dL — ABNORMAL LOW (ref 3.6–5.1)
BILIRUBIN DIRECT: 0.1 mg/dL (ref <=0.2)
BILIRUBIN TOTAL: 0.5 mg/dL (ref 0.2–1.2)
Indirect Bilirubin: 0.4 mg/dL (ref 0.2–1.2)
Total Protein: 6.1 g/dL (ref 6.1–8.1)

## 2016-04-11 LAB — FERRITIN: Ferritin: 16 ng/mL (ref 10–232)

## 2016-04-13 ENCOUNTER — Ambulatory Visit (INDEPENDENT_AMBULATORY_CARE_PROVIDER_SITE_OTHER): Payer: 59 | Admitting: Gastroenterology

## 2016-04-13 ENCOUNTER — Other Ambulatory Visit: Payer: Self-pay | Admitting: Gastroenterology

## 2016-04-13 ENCOUNTER — Encounter: Payer: Self-pay | Admitting: Gastroenterology

## 2016-04-13 VITALS — BP 123/73 | HR 72 | Temp 97.1°F | Ht 65.0 in | Wt 240.2 lb

## 2016-04-13 DIAGNOSIS — R7989 Other specified abnormal findings of blood chemistry: Secondary | ICD-10-CM

## 2016-04-13 DIAGNOSIS — D509 Iron deficiency anemia, unspecified: Secondary | ICD-10-CM | POA: Diagnosis not present

## 2016-04-13 DIAGNOSIS — K760 Fatty (change of) liver, not elsewhere classified: Secondary | ICD-10-CM

## 2016-04-13 DIAGNOSIS — R1011 Right upper quadrant pain: Secondary | ICD-10-CM

## 2016-04-13 DIAGNOSIS — R945 Abnormal results of liver function studies: Secondary | ICD-10-CM

## 2016-04-13 DIAGNOSIS — K7581 Nonalcoholic steatohepatitis (NASH): Secondary | ICD-10-CM | POA: Insufficient documentation

## 2016-04-13 MED ORDER — FERROUS SULFATE 325 (65 FE) MG PO TBEC: DELAYED_RELEASE_TABLET | ORAL | Status: DC

## 2016-04-13 NOTE — Progress Notes (Signed)
Patient aware. Addressed at [place] today.

## 2016-04-13 NOTE — Progress Notes (Addendum)
REVIEWED-NO ADDITIONAL RECOMMENDATIONS.   Primary Care Physician: Alonza Bogus, MD  Primary Gastroenterologist:  Barney Drain, MD   Chief Complaint  Patient presents with  . Follow-up    abdominal pain not as frequent    HPI: Rachel Branch is a 60 y.o. female here for three-month follow-up. She has a history of abdominal pain and abnormal LFTs. Abdominal pain typically right upper quadrant, crampy, related to certain foods. Zegerid has seemed to help in the past. No prior colonoscopy and patient has refused. History of abnormal LFTs, negative hepatitis B and C serologies. No evidence of hemochromatosis based on iron studies. Actually with mildly low iron, saturation, normal TIBC, low normal ferritin. Celiac screen negative. Immunoglobulins normal. ANA, AMA, smooth muscle antibody all negative. Ceruloplasmin normal. Recent repeated numbers as outlined below. Abdominal ultrasound November 2016 with layering gallbladder sludge, fatty liver. HIDA scan in 2014 with gallbladder ejection fraction of 15.8%. Patient refuses repeat HIDA scan due to claustrophobia.  Weight fluctuates between 230 and 240 pounds over the past one year. Just started to add some exercise into her daily regimen. Not consistent with it yet. Eating out of the garden a lot but still eats some unhealthy foods. Having trouble losing Weight but admits that she's not doing everything correctly. She consumes 4-5 glasses of sweetened tea daily, some ginger ale otherwise water. On a day-to-day basis she does well. A couple times a month she has quick but short-lived episode of right upper quadrant pain. Usually goes away if she takes a Zegerid. Or other days she has to take omeprazole because her insurance will not pay for Zegerid. Denies dysphagia, vomiting, heartburn. Bowel movements are regular. No blood in the stool or melena.   Continues to refuse colonoscopy. Does not want to have her gallbladder out. Doesn't want to have  surgery or get sedated anytime soon. She's had frequent surgeries over the last few years and she wants a break.  Current Outpatient Prescriptions  Medication Sig Dispense Refill  . cetirizine (ZYRTEC) 10 MG tablet Take 10 mg by mouth daily.    Marland Kitchen levothyroxine (SYNTHROID, LEVOTHROID) 112 MCG tablet TAKE 1 TABLET(112 MCG) BY MOUTH DAILY BEFORE BREAKFAST 30 tablet 12  . loperamide (IMODIUM) 2 MG capsule Take by mouth as needed for diarrhea or loose stools.    Marland Kitchen omeprazole (PRILOSEC) 20 MG capsule Take 1 capsule (20 mg total) by mouth daily before breakfast. 90 capsule 3  . omeprazole-sodium bicarbonate (ZEGERID) 40-1100 MG capsule Take 1 capsule by mouth daily before breakfast. (Patient taking differently: Take 1 capsule by mouth daily before breakfast. Takes only when she has a bad episode) 30 capsule 5  . Simethicone (GAS-X PO) Take 1 capsule by mouth as needed (gas).     Marland Kitchen VITAMIN B1-B12 IM Inject 1 mL into the muscle. Inject twice a month    . Vitamin D, Ergocalciferol, (DRISDOL) 50000 units CAPS capsule TAKE 1 CAPSULE BY MOUTH EVERY 7 DAYS 12 capsule 0   No current facility-administered medications for this visit.     Allergies as of 04/13/2016 - Review Complete 04/13/2016  Allergen Reaction Noted  . Latex Rash 04/01/2011   Past Medical History:  Diagnosis Date  . Hypothyroidism   . IBS (irritable bowel syndrome)   . Pernicious anemia    B12 injections every other week  . Simple endometrial hyperplasia without atypia 08/2013   On hysteroscopic polyp resection pathology   Past Surgical History:  Procedure Laterality Date  . COMBINED HYSTEROSCOPY DIAGNOSTIC /  D&C  03/14/11   benign endometrial polyps  . CYSTOSCOPY N/A 07/28/2015   Procedure: CYSTOSCOPY;  Surgeon: Anastasio Auerbach, MD;  Location: [facility];  Service: Gynecology;  Laterality: N/A;  . DILATATION & CURETTAGE/HYSTEROSCOPY WITH MYOSURE N/A 12/02/2014   Procedure: DILATATION & CURETTAGE/HYSTEROSCOPY WITH MYOSURE;  Surgeon:  Anastasio Auerbach, MD;  Location: [facility];  Service: Gynecology;  Laterality: N/A;  . DILATATION & CURETTAGE/HYSTEROSCOPY WITH TRUECLEAR N/A 09/18/2013   Procedure: DILATATION & CURETTAGE/HYSTEROSCOPY WITH TRUCLEAR;  Surgeon: Anastasio Auerbach, MD;  Location: [facility];  Service: Gynecology;  Laterality: N/A;  . DILATION AND CURETTAGE OF UTERUS  2007  . LAPAROSCOPIC ASSISTED VAGINAL HYSTERECTOMY N/A 07/28/2015   Procedure: LAPAROSCOPIC ASSISTED VAGINAL HYSTERECTOMY;  Surgeon: Anastasio Auerbach, MD;  Location: [facility];  Service: Gynecology;  Laterality: N/A;  . LAPAROSCOPIC BILATERAL SALPINGO OOPHERECTOMY Bilateral 07/28/2015   Procedure: LAPAROSCOPIC BILATERAL SALPINGO OOPHORECTOMY;  Surgeon: Anastasio Auerbach, MD;  Location: [facility];  Service: Gynecology;  Laterality: Bilateral;  . LAPAROSCOPIC LYSIS OF ADHESIONS N/A 07/28/2015   Procedure: LAPAROSCOPIC LYSIS OF ADHESIONS;  Surgeon: Anastasio Auerbach, MD;  Location: [facility];  Service: Gynecology;  Laterality: N/A;  . TUBAL LIGATION  1997    ROS:  General: Negative for anorexia, weight loss, fever, chills, fatigue, weakness. ENT: Negative for hoarseness, difficulty swallowing , nasal congestion. CV: Negative for chest pain, angina, palpitations, dyspnea on exertion, peripheral edema.  Respiratory: Negative for dyspnea at rest, dyspnea on exertion, cough, sputum, wheezing.  GI: See history of present illness. GU:  Negative for dysuria, hematuria, urinary incontinence, urinary frequency, nocturnal urination.  Endo: Negative for unusual weight change.    Physical Examination:   BP 123/73   Pulse 72   Temp 97.1 F (36.2 C) (Oral)   Ht 5\' 5"  (1.651 m)   Wt 240 lb 3.2 oz (109 kg)   LMP 02/18/2011   BMI 39.97 kg/m   General: Well-nourished, well-developed in no acute distress.  Eyes: No icterus. Mouth: Oropharyngeal mucosa moist and pink , no lesions erythema or exudate. Lungs: Clear to auscultation bilaterally.  Heart: Regular rate and  rhythm, no murmurs rubs or gallops.  Abdomen: Bowel sounds are normal, nontender, nondistended, no hepatosplenomegaly or masses, no abdominal bruits or hernia , no rebound or guarding.   Extremities: No lower extremity edema. No clubbing or deformities. Neuro: Alert and oriented x 4   Skin: Warm and dry, no jaundice.   Psych: Alert and cooperative, normal mood and affect.  Labs:  Lab Results  Component Value Date   ALT 76 (H) 04/11/2016   AST 45 (H) 04/11/2016   ALKPHOS 143 (H) 04/11/2016   BILITOT 0.5 04/11/2016   Lab Results  Component Value Date   IRON 36 (L) 04/11/2016   TIBC 307 04/11/2016   FERRITIN 16 04/11/2016    Imaging Studies: No results found.

## 2016-04-13 NOTE — Assessment & Plan Note (Signed)
Evaluation thus far c/w with fatty liver. Mildly elevated AP, AST/ALT for several years and very stable. Fatty liver on u/s. AMA, ANA, ASMA, immunoglobulins negative. Viral hepatitis markers negative. No evidence of hemochromatosis. Would like to see what her numbers would do if she actively tried to lose weight and become more active. Also have discussed with patient, if she ends up with a gallbladder surgery, she should request a liver biopsy. At this time patient does not want to pursue any invasive testing.  She has low normal ferritin, low iron but other indices were normal. Recommend adding iron supplement 3 times weekly if tolerated. Patient again refuses colonoscopy. Repeat numbers along with LFTs, CBC in 4 months. Return to the office to see Dr. Oneida Alar in 6 months. Patient call sooner if needed.

## 2016-04-13 NOTE — Patient Instructions (Signed)
1. Please start ferrous sulfate 325 mg 3 times weekly. You can buy this over-the-counter. 2. Repeat iron, liver numbers, hemoglobin in 4 months. 3. Continue to consume low-fat diet. If your episodes of abdominal pain become more frequent and/or debilitating, and you require gallbladder surgery, request surgeon to perform liver biopsy at the same time. 4. Return to the office in 6 months for an appointment with Dr. Oneida Alar.  Instructions for fatty liver: Recommend 1-2# weight loss per week until ideal body weight through exercise & diet. Low fat/cholesterol diet.   Avoid sweets, sodas, fruit juices, sweetened beverages like tea, etc. Gradually increase exercise from 15 min daily up to 1 hr per day 5 days/week. Limit alcohol use.

## 2016-04-13 NOTE — Assessment & Plan Note (Addendum)
Continue PPI therapy. Continue low-fat diet. Cannot rule out intermittent biliary etiology i.e. gallbladder. Patient managing fairly well with dietary measures. At some point in the future if her symptoms become more frequent, cannot exclude possibility of surgical consultation to consider gallbladder surgery (liver biopsy for abnormal LFTs) versus EGD. At this time patient wants to avoid invasive procedures. Return to the office in 6 months for follow-up or call sooner if needed.

## 2016-04-13 NOTE — Progress Notes (Signed)
cc'ed to pcp °

## 2016-05-12 ENCOUNTER — Other Ambulatory Visit (HOSPITAL_COMMUNITY): Payer: Self-pay | Admitting: Pulmonary Disease

## 2016-05-12 ENCOUNTER — Ambulatory Visit (HOSPITAL_COMMUNITY)
Admission: RE | Admit: 2016-05-12 | Discharge: 2016-05-12 | Disposition: A | Discharge status: Home / Self Care | Payer: 59 | Source: Ambulatory Visit | Attending: Pulmonary Disease | Admitting: Pulmonary Disease

## 2016-05-12 DIAGNOSIS — R071 Chest pain on breathing: Secondary | ICD-10-CM

## 2016-05-12 DIAGNOSIS — R079 Chest pain, unspecified: Secondary | ICD-10-CM | POA: Diagnosis not present

## 2016-06-06 ENCOUNTER — Other Ambulatory Visit: Payer: 59

## 2016-06-06 DIAGNOSIS — E559 Vitamin D deficiency, unspecified: Secondary | ICD-10-CM

## 2016-06-07 ENCOUNTER — Other Ambulatory Visit: Payer: Self-pay | Admitting: Gynecology

## 2016-06-07 DIAGNOSIS — E559 Vitamin D deficiency, unspecified: Secondary | ICD-10-CM

## 2016-06-07 LAB — VITAMIN D 25 HYDROXY (VIT D DEFICIENCY, FRACTURES): VIT D 25 HYDROXY: 20 ng/mL — AB (ref 30–100)

## 2016-06-07 MED ORDER — VITAMIN D (ERGOCALCIFEROL) 1.25 MG (50000 UNIT) PO CAPS: 50000.0000 [IU] | ORAL_CAPSULE | ORAL | 0 refills | Status: DC

## 2016-06-21 ENCOUNTER — Other Ambulatory Visit: Payer: Self-pay

## 2016-06-21 ENCOUNTER — Telehealth: Payer: Self-pay | Admitting: Gastroenterology

## 2016-06-21 DIAGNOSIS — R7989 Other specified abnormal findings of blood chemistry: Secondary | ICD-10-CM

## 2016-06-21 DIAGNOSIS — D509 Iron deficiency anemia, unspecified: Secondary | ICD-10-CM

## 2016-06-21 DIAGNOSIS — R945 Abnormal results of liver function studies: Secondary | ICD-10-CM

## 2016-06-21 NOTE — Telephone Encounter (Signed)
Pt said she received a message from GF via mychart about having labs done and a form would be attached. Patient said the form wasn't attached and could we mail it to her.

## 2016-06-21 NOTE — Telephone Encounter (Signed)
Orders will be going out to her in the mail.

## 2016-08-10 LAB — CBC WITH DIFFERENTIAL/PLATELET
BASOS ABS: 0 {cells}/uL (ref 0–200)
Basophils Relative: 0 %
EOS PCT: 2 %
Eosinophils Absolute: 182 cells/uL (ref 15–500)
HCT: 37 % (ref 35.0–45.0)
Hemoglobin: 12.1 g/dL (ref 11.7–15.5)
Lymphocytes Relative: 24 %
Lymphs Abs: 2184 cells/uL (ref 850–3900)
MCH: 27.6 pg (ref 27.0–33.0)
MCHC: 32.7 g/dL (ref 32.0–36.0)
MCV: 84.5 fL (ref 80.0–100.0)
MONOS PCT: 12 %
MPV: 9.7 fL (ref 7.5–12.5)
Monocytes Absolute: 1092 cells/uL — ABNORMAL HIGH (ref 200–950)
NEUTROS ABS: 5642 {cells}/uL (ref 1500–7800)
NEUTROS PCT: 62 %
PLATELETS: 332 10*3/uL (ref 140–400)
RBC: 4.38 MIL/uL (ref 3.80–5.10)
RDW: 14.8 % (ref 11.0–15.0)
WBC: 9.1 10*3/uL (ref 3.8–10.8)

## 2016-08-11 LAB — HEPATIC FUNCTION PANEL
ALT: 131 U/L — ABNORMAL HIGH (ref 6–29)
AST: 78 U/L — AB (ref 10–35)
Albumin: 3.6 g/dL (ref 3.6–5.1)
Alkaline Phosphatase: 153 U/L — ABNORMAL HIGH (ref 33–130)
BILIRUBIN DIRECT: 0.1 mg/dL (ref <=0.2)
BILIRUBIN INDIRECT: 0.2 mg/dL (ref 0.2–1.2)
BILIRUBIN TOTAL: 0.3 mg/dL (ref 0.2–1.2)
Total Protein: 6.5 g/dL (ref 6.1–8.1)

## 2016-08-11 LAB — IRON AND TIBC
%SAT: 7 % — AB (ref 11–50)
IRON: 23 ug/dL — AB (ref 45–160)
TIBC: 342 ug/dL (ref 250–450)
UIBC: 319 ug/dL (ref 125–400)

## 2016-08-11 LAB — FERRITIN: FERRITIN: 16 ng/mL (ref 10–232)

## 2016-08-19 ENCOUNTER — Other Ambulatory Visit: Payer: Self-pay | Admitting: Pulmonary Disease

## 2016-08-19 DIAGNOSIS — E039 Hypothyroidism, unspecified: Secondary | ICD-10-CM

## 2016-08-22 NOTE — Progress Notes (Signed)
LFTs somewhat worse. Iron low, ferritin low normal but normal Hgb.  She needs to complete ifobt before her upcoming OV with [facility]. Keep OV with [facility].

## 2016-08-23 NOTE — Progress Notes (Signed)
LM for pt to call

## 2016-08-24 ENCOUNTER — Telehealth: Payer: Self-pay

## 2016-08-24 NOTE — Progress Notes (Signed)
Pt is ware of results. She will come by the office and pickup the iFOBT and do before her appt with Dr. Oneida Alar on 09/21/2016. ( IFOBT at front for pick up).

## 2016-08-24 NOTE — Telephone Encounter (Signed)
Per Rachel Branch, pt called back and said she is not gong to do the iFOBT, she is going to her PCP first.

## 2016-08-25 ENCOUNTER — Ambulatory Visit (HOSPITAL_COMMUNITY)
Admission: RE | Admit: 2016-08-25 | Discharge: 2016-08-25 | Disposition: A | Discharge status: Home / Self Care | Payer: 59 | Source: Ambulatory Visit | Attending: Pulmonary Disease | Admitting: Pulmonary Disease

## 2016-08-25 DIAGNOSIS — E039 Hypothyroidism, unspecified: Secondary | ICD-10-CM

## 2016-08-25 NOTE — Telephone Encounter (Addendum)
Noted. Please encourage keeping ov with [facility] for abnormal lfts and iron def.

## 2016-08-25 NOTE — Telephone Encounter (Signed)
Called and left Vm to make sure she keeps upcoming appt with Dr. Oneida Alar and to call if questions.

## 2016-08-26 ENCOUNTER — Other Ambulatory Visit: Payer: Self-pay | Admitting: Gynecology

## 2016-08-30 ENCOUNTER — Encounter: Payer: Self-pay | Admitting: Gastroenterology

## 2016-09-05 ENCOUNTER — Other Ambulatory Visit: Payer: 59

## 2016-09-05 DIAGNOSIS — E559 Vitamin D deficiency, unspecified: Secondary | ICD-10-CM

## 2016-09-06 LAB — VITAMIN D 25 HYDROXY (VIT D DEFICIENCY, FRACTURES): Vit D, 25-Hydroxy: 36 ng/mL (ref 30–100)

## 2016-09-08 ENCOUNTER — Telehealth: Payer: Self-pay | Admitting: Gastroenterology

## 2016-09-08 NOTE — Telephone Encounter (Signed)
Pt received letter that it was time to make a follow up visit. Pt is wanting to wait until after she sees her PCP first. She will call back to schedule and wants to see [facility]

## 2016-09-09 NOTE — Telephone Encounter (Addendum)
Unfortunately patient is declining to follow recommendations.  She has worsening LFTs, iron deficiency.   Next OV needs to be with [facility].

## 2016-09-09 NOTE — Telephone Encounter (Signed)
REVIEWED-NO ADDITIONAL RECOMMENDATIONS. 

## 2016-09-09 NOTE — Telephone Encounter (Signed)
Forwarding FYI to Dr. Oneida Alar and Neil Crouch, PA who saw pt last.

## 2016-09-14 NOTE — Telephone Encounter (Signed)
Noted  

## 2016-09-21 ENCOUNTER — Ambulatory Visit: Payer: 59 | Admitting: Gastroenterology

## 2016-10-10 ENCOUNTER — Encounter: Payer: Self-pay | Admitting: Gastroenterology

## 2016-10-11 ENCOUNTER — Telehealth: Payer: Self-pay | Admitting: Gastroenterology

## 2016-10-11 ENCOUNTER — Encounter: Payer: Self-pay | Admitting: Gastroenterology

## 2016-10-11 NOTE — Telephone Encounter (Signed)
APPT MADE AND LETTER SENT  °

## 2016-10-11 NOTE — Telephone Encounter (Signed)
Please advise 

## 2016-10-11 NOTE — Telephone Encounter (Signed)
Pt said she is having more problems with nausea and some vomiting.   She has abdominal pain on occasions that just about doubles her over.   She is having BM's about twice a day after meals and they are soft, not diarrhea.  When she has the abdominal pain, she also has pain between her shoulder blades.   She is taking Prilosec daily, and sometimes takes OTC Zegerid in addition to the Prilosec.   She said she has a lot of gas all of the time.  Please advise since she is supposed to have appt with Dr. Oneida Alar and she does not have an appt for a month.

## 2016-10-11 NOTE — Telephone Encounter (Signed)
We haven't seen patient in over six months so we cannot provide referral without seeing her.   She also has pending GI issues, abnormal LFTs, IDA that she was supposed to see [facility] ONLY a couple of weeks ago but appointment was cancelled.   I recommend she see [facility] for follow up of all issues as stated.

## 2016-10-11 NOTE — Telephone Encounter (Signed)
[name] please call patient regarding her gallbladder, last time she saw [place] here was told to call if it got worse.  Were we going to refer her somewhere and since her insurance is new, does she need an office visit?

## 2016-10-11 NOTE — Telephone Encounter (Signed)
Pt called to see if she could be seen any sooner that 2/22 with SF. LSL said that the patient needed to see SF and not extender. I told patient that I would put her on a call back list if SF has anything available before then. Pt wants to know what is she suppose to do for the next month in pain. I told her that I would have the nurse call her with recommendations. CC:[id number]

## 2016-10-11 NOTE — Telephone Encounter (Signed)
Please make her an appointment with [facility]

## 2016-10-11 NOTE — Telephone Encounter (Signed)
Patient's daughter contacted me (daughter works as a Marine scientist at [employer]), asking if there was any way her mom could be seen sooner. I spoke with Magda Paganini, who has seen patient before.   I can see her tomorrow at 8:30 (had an opening) to address acute issues of abdominal pain. Follow liquid diet, low fat food, to ED if worsening. Patient aware. Keep late February appt for now, as she has other loose ends that need to be addressed.

## 2016-10-12 ENCOUNTER — Ambulatory Visit (INDEPENDENT_AMBULATORY_CARE_PROVIDER_SITE_OTHER): Payer: 59 | Admitting: Gastroenterology

## 2016-10-12 ENCOUNTER — Encounter: Payer: Self-pay | Admitting: Gastroenterology

## 2016-10-12 ENCOUNTER — Other Ambulatory Visit: Payer: Self-pay

## 2016-10-12 ENCOUNTER — Telehealth: Payer: Self-pay

## 2016-10-12 ENCOUNTER — Ambulatory Visit: Payer: 59 | Admitting: Gastroenterology

## 2016-10-12 VITALS — BP 137/87 | HR 75 | Temp 97.8°F | Ht 64.0 in | Wt 241.6 lb

## 2016-10-12 DIAGNOSIS — R7989 Other specified abnormal findings of blood chemistry: Secondary | ICD-10-CM

## 2016-10-12 DIAGNOSIS — R1011 Right upper quadrant pain: Secondary | ICD-10-CM | POA: Diagnosis not present

## 2016-10-12 DIAGNOSIS — R945 Abnormal results of liver function studies: Secondary | ICD-10-CM

## 2016-10-12 LAB — HEPATIC FUNCTION PANEL
ALBUMIN: 3.7 g/dL (ref 3.6–5.1)
ALK PHOS: 147 U/L — AB (ref 33–130)
ALT: 151 U/L — ABNORMAL HIGH (ref 6–29)
AST: 104 U/L — AB (ref 10–35)
BILIRUBIN TOTAL: 0.6 mg/dL (ref 0.2–1.2)
Bilirubin, Direct: 0.1 mg/dL (ref <=0.2)
Indirect Bilirubin: 0.5 mg/dL (ref 0.2–1.2)
TOTAL PROTEIN: 6.5 g/dL (ref 6.1–8.1)

## 2016-10-12 NOTE — Patient Instructions (Signed)
Please have blood work done this morning. We will call with results.  I have referred you to Dr. Arnoldo Morale for a cholecystectomy, and we have requested that he do a liver biopsy at time of the surgery.  Avoid any fried, greasy foods. Eat a bland diet for now. Seek emergency medical attention if any fever over 101.5, chills, nausea and vomiting that do not subside on own.   We will have you see Dr. Oneida Alar in March, giving you a chance to recover!

## 2016-10-12 NOTE — Assessment & Plan Note (Signed)
61 year old female with symptoms that appear to be biliary in nature, worsening since Christmas. 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 As she has had chronic symptoms and now worsening, will go ahead and refer to Dr. Arnoldo Morale for cholecystectomy.   She has had chronically elevated transaminases/mildly elevated alk phos with a thorough work-up. Appears this is likely related to fatty liver; however, we are requesting a liver biopsy at time of cholecystectomy.   She will return to see Dr. Oneida Alar as per Leslie's plan sometime in March to follow-up on overall symptoms. She has been hesitant to pursue colonoscopy historically. Will address RUQ pain today, check HFP stat, and proceed with referral to Dr. Arnoldo Morale (patient specifically requests Dr. Arnoldo Morale as he has performed surgery for her daughter, who works as a Marine scientist at [employer]).

## 2016-10-12 NOTE — Telephone Encounter (Signed)
REVIEWED-NO ADDITIONAL RECOMMENDATIONS. 

## 2016-10-12 NOTE — Progress Notes (Signed)
Transaminases continue to go up. Bilirubin is normal. Her last ultrasound was Nov 2016 from what I can see. As they continue to go up and she had this worsening of pain, let's get an updated RUQ US abdomen so that it's ready for when Dr. Arnoldo Morale sees her. Needs to have done this week (Thursday or at the absolute latest Friday).

## 2016-10-12 NOTE — Progress Notes (Addendum)
REVIEWED-NO ADDITIONAL RECOMMENDATIONS.  Referring Provider: Sinda Du, MD Primary Care Physician:  Alonza Bogus, MD Primary GI: Dr. Oneida Alar   Chief Complaint  Patient presents with  . Abdominal Pain    HPI:   Rachel Branch is a 61 y.o. female presenting today as an urgent work-in due to acute on chronic abdominal pain. History of RUQ abdominal pain, US abdomen in Nov 2016 with layering gallbladder sludge and fatty liver. HIDA scan in 2014 with EF of 15.8%. Patient has declined repeat HIDA due to claustrophobia. Has been hesitant for pursual of evaluation for cholecystectomy in the past due to concern for sedation, wanting a break due to surgeries over past few years. She also has a history of abnormal transaminases and elevated alk phos, negative Hep B and C serologies. No hemochromatosis (has mildly low iron, sat, and normal TIBC, low normal ferritin). Celiac screen negative. ANA, AMA, ASMA, immunoglobulins normal. Most recent HFP on file from Nov 2017 with AST 78, ALT 131, Alk Phos 153. Continues to fluctuate.   Historically would have pain that doubled her over related to eating. Since Christmas, she has had an increase in her symptoms, more frequent, hurting between shoulder blades. Zegerid has eased pain in the past but insurance doesn't cover. Has been taking Prilosec. Has always had trouble with gas build-up but now it just doesn't stop. Pain located in RUQ. Anything greasy sets off the discomfort. Unable to eat corn. Sometimes icecream will set off. Greasy foods make her very nauseated. No vomiting except for once, which was yellow about a week ago. No fever or chills.   Past Medical History:  Diagnosis Date  . Hypothyroidism   . IBS (irritable bowel syndrome)   . Pernicious anemia    B12 injections every other week  . Simple endometrial hyperplasia without atypia 08/2013   On hysteroscopic polyp resection pathology    Past Surgical History:  Procedure Laterality Date    . COMBINED HYSTEROSCOPY DIAGNOSTIC / D&C  03/14/11   benign endometrial polyps  . CYSTOSCOPY N/A 07/28/2015   Procedure: CYSTOSCOPY;  Surgeon: Anastasio Auerbach, MD;  Location: [facility];  Service: Gynecology;  Laterality: N/A;  . DILATATION & CURETTAGE/HYSTEROSCOPY WITH MYOSURE N/A 12/02/2014   Procedure: DILATATION & CURETTAGE/HYSTEROSCOPY WITH MYOSURE;  Surgeon: Anastasio Auerbach, MD;  Location: [facility];  Service: Gynecology;  Laterality: N/A;  . DILATATION & CURETTAGE/HYSTEROSCOPY WITH TRUECLEAR N/A 09/18/2013   Procedure: DILATATION & CURETTAGE/HYSTEROSCOPY WITH TRUCLEAR;  Surgeon: Anastasio Auerbach, MD;  Location: [facility];  Service: Gynecology;  Laterality: N/A;  . DILATION AND CURETTAGE OF UTERUS  2007  . LAPAROSCOPIC ASSISTED VAGINAL HYSTERECTOMY N/A 07/28/2015   Procedure: LAPAROSCOPIC ASSISTED VAGINAL HYSTERECTOMY;  Surgeon: Anastasio Auerbach, MD;  Location: [facility];  Service: Gynecology;  Laterality: N/A;  . LAPAROSCOPIC BILATERAL SALPINGO OOPHERECTOMY Bilateral 07/28/2015   Procedure: LAPAROSCOPIC BILATERAL SALPINGO OOPHORECTOMY;  Surgeon: Anastasio Auerbach, MD;  Location: [facility];  Service: Gynecology;  Laterality: Bilateral;  . LAPAROSCOPIC LYSIS OF ADHESIONS N/A 07/28/2015   Procedure: LAPAROSCOPIC LYSIS OF ADHESIONS;  Surgeon: Anastasio Auerbach, MD;  Location: [facility];  Service: Gynecology;  Laterality: N/A;  . TUBAL LIGATION  1997    Current Outpatient Prescriptions  Medication Sig Dispense Refill  . cetirizine (ZYRTEC) 10 MG tablet Take 10 mg by mouth daily.    . Cholecalciferol (VITAMIN D PO) Take 200 Units by mouth daily.    . ferrous sulfate 325 (65 FE) MG EC tablet Three times weekly.    Marland Kitchen  levothyroxine (SYNTHROID, LEVOTHROID) 112 MCG tablet TAKE 1 TABLET(112 MCG) BY MOUTH DAILY BEFORE BREAKFAST 30 tablet 12  . loperamide (IMODIUM) 2 MG capsule Take by mouth as needed for diarrhea or loose stools.    Marland Kitchen omeprazole (PRILOSEC) 20 MG capsule Take 1 capsule (20 mg total) by mouth  daily before breakfast. 90 capsule 3  . omeprazole-sodium bicarbonate (ZEGERID) 40-1100 MG capsule Take 1 capsule by mouth daily before breakfast. (Patient taking differently: Take 1 capsule by mouth daily before breakfast. Takes only when she has a bad episode) 30 capsule 5  . Simethicone (GAS-X PO) Take 1 capsule by mouth as needed (gas).     Marland Kitchen VITAMIN B1-B12 IM Inject 1 mL into the muscle. Inject twice a month    . Vitamin D, Ergocalciferol, (DRISDOL) 50000 units CAPS capsule Take 1 capsule (50,000 Units total) by mouth 2 (two) times a week. (Patient not taking: Reported on 10/12/2016) 24 capsule 0   No current facility-administered medications for this visit.     Allergies as of 10/12/2016 - Review Complete 10/12/2016  Allergen Reaction Noted  . Latex Rash 04/01/2011    Family History  Problem Relation Age of Onset  . Diabetes Mother   . Hypertension Mother   . Heart disease Mother   . Hypertension Father   . Diabetes Brother   . Cancer Brother     prostate  . Colon cancer Neg Hx     Social History   Social History  . Marital status: Married    Spouse name: N/A  . Number of children: 3  . Years of education: N/A   Social History Main Topics  . Smoking status: Never Smoker  . Smokeless tobacco: Never Used     Comment: Never smoked  . Alcohol use No  . Drug use: No  . Sexual activity: Yes    Partners: Male     Comment: 1st intercourse 58 yo-1 partner   Other Topics Concern  . Not on file   Social History Narrative  . No narrative on file    Review of Systems: As mentioned in HPI   Physical Exam: BP 137/87   Pulse 75   Temp 97.8 F (36.6 C) (Oral)   Ht 5' 4"  (1.626 m)   Wt 241 lb 9.6 oz (109.6 kg)   LMP 02/18/2011   BMI 41.47 kg/m  General:   Alert and oriented. No distress noted. Pleasant and cooperative.  Head:  Normocephalic and atraumatic. Eyes:  Conjuctiva clear without scleral icterus. Abdomen:  +BS, soft, mild discomfort RUQ but no rebound or  guarding with deep palpation. No HSM or masses noted. Msk:  Symmetrical without gross deformities. Normal posture. Extremities:  Without edema. Neurologic:  Alert and  oriented x4;  grossly normal neurologically. Psych:  Alert and cooperative. Normal mood and affect.  Lab Results  Component Value Date   ALT 131 (H) 08/10/2016   AST 78 (H) 08/10/2016   ALKPHOS 153 (H) 08/10/2016   BILITOT 0.3 08/10/2016   Lab Results  Component Value Date   CREATININE 0.90 12/03/2015   BUN 12 12/03/2015   NA 140 12/03/2015   K 4.6 12/03/2015   CL 102 12/03/2015   CO2 28 12/03/2015   Lab Results  Component Value Date   WBC 9.1 08/10/2016   HGB 12.1 08/10/2016   HCT 37.0 08/10/2016   MCV 84.5 08/10/2016   PLT 332 08/10/2016   Lab Results  Component Value Date   IRON 23 (L) 08/10/2016  TIBC 342 08/10/2016   FERRITIN 16 08/10/2016

## 2016-10-12 NOTE — Telephone Encounter (Signed)
Forwarding to [place], I do not see an appt in Feb.

## 2016-10-12 NOTE — Telephone Encounter (Signed)
Pt has appt with surgeon (Dr. Arnoldo Morale) 10/20/16 at 10:45am. Called and informed pt.

## 2016-10-12 NOTE — Telephone Encounter (Signed)
I put her back in her late feb appointemnt time with [facility]

## 2016-10-13 ENCOUNTER — Ambulatory Visit (HOSPITAL_COMMUNITY)
Admission: RE | Admit: 2016-10-13 | Discharge: 2016-10-13 | Disposition: A | Discharge status: Home / Self Care | Payer: 59 | Source: Ambulatory Visit | Attending: Gastroenterology | Admitting: Gastroenterology

## 2016-10-13 DIAGNOSIS — R7989 Other specified abnormal findings of blood chemistry: Secondary | ICD-10-CM | POA: Insufficient documentation

## 2016-10-13 DIAGNOSIS — K8020 Calculus of gallbladder without cholecystitis without obstruction: Secondary | ICD-10-CM | POA: Diagnosis not present

## 2016-10-13 DIAGNOSIS — R945 Abnormal results of liver function studies: Secondary | ICD-10-CM

## 2016-10-13 DIAGNOSIS — R932 Abnormal findings on diagnostic imaging of liver and biliary tract: Secondary | ICD-10-CM | POA: Insufficient documentation

## 2016-10-13 NOTE — Progress Notes (Signed)
cc'ed to pcp °

## 2016-10-20 DIAGNOSIS — K811 Chronic cholecystitis: Secondary | ICD-10-CM | POA: Diagnosis not present

## 2016-10-20 NOTE — Patient Instructions (Signed)
Rachel Branch  10/20/2016     @PREFPERIOPPHARMACY @   Your procedure is scheduled on  10/24/2016   Report to [facility] at  615  A.M.  Call this number if you have problems the morning of surgery:  [phone]   Remember:  Do not eat food or drink liquids after midnight.  Take these medicines the morning of surgery with A SIP OF WATER  Zyrtec, levothyroxine, prilosec.   Do not wear jewelry, make-up or nail polish.  Do not wear lotions, powders, or perfumes, or deoderant.  Do not shave 48 hours prior to surgery.  Men may shave face and neck.  Do not bring valuables to the hospital.  [facility] is not responsible for any belongings or valuables.  Contacts, dentures or bridgework may not be worn into surgery.  Leave your suitcase in the car.  After surgery it may be brought to your room.  For patients admitted to the hospital, discharge time will be determined by your treatment team.  Patients discharged the day of surgery will not be allowed to drive home.   Name and phone number of your driver:   family Special instructions:  none  Please read over the following fact sheets that you were given. Anesthesia Post-op Instructions and Care and Recovery After Surgery       Laparoscopic Cholecystectomy Laparoscopic cholecystectomy is surgery to remove the gallbladder. The gallbladder is a pear-shaped organ that lies beneath the liver on the right side of the body. The gallbladder stores bile, which is a fluid that helps the body to digest fats. Cholecystectomy is often done for inflammation of the gallbladder (cholecystitis). This condition is usually caused by a buildup of gallstones (cholelithiasis) in the gallbladder. Gallstones can block the flow of bile, which can result in inflammation and pain. In severe cases, emergency surgery may be required. This procedure is done though small incisions in your abdomen (laparoscopic surgery). A thin scope with a  camera (laparoscope) is inserted through one incision. Thin surgical instruments are inserted through the other incisions. In some cases, a laparoscopic procedure may be turned into a type of surgery that is done through a larger incision (open surgery). Tell a health care provider about:  Any allergies you have.  All medicines you are taking, including vitamins, herbs, eye drops, creams, and over-the-counter medicines.  Any problems you or family members have had with anesthetic medicines.  Any blood disorders you have.  Any surgeries you have had.  Any medical conditions you have.  Whether you are pregnant or may be pregnant. What are the risks? Generally, this is a safe procedure. However, problems may occur, including:  Infection.  Bleeding.  Allergic reactions to medicines.  Damage to other structures or organs.  A stone remaining in the common bile duct. The common bile duct carries bile from the gallbladder into the small intestine.  A bile leak from the cyst duct that is clipped when your gallbladder is removed. What happens before the procedure? Staying hydrated  Follow instructions from your health care provider about hydration, which may include:  Up to 2 hours before the procedure - you may continue to drink clear liquids, such as water, clear fruit juice, black coffee, and plain tea. Eating and drinking restrictions  Follow instructions from your health care provider about eating and drinking, which may include:  8 hours before the procedure - stop  eating heavy meals or foods such as meat, fried foods, or fatty foods.  6 hours before the procedure - stop eating light meals or foods, such as toast or cereal.  6 hours before the procedure - stop drinking milk or drinks that contain milk.  2 hours before the procedure - stop drinking clear liquids. Medicines  Ask your health care provider about:  Changing or stopping your regular medicines. This is  especially important if you are taking diabetes medicines or blood thinners.  Taking medicines such as aspirin and ibuprofen. These medicines can thin your blood. Do not take these medicines before your procedure if your health care provider instructs you not to.  You may be given antibiotic medicine to help prevent infection. General instructions  Let your health care provider know if you develop a cold or an infection before surgery.  Plan to have someone take you home from the hospital or clinic.  Ask your health care provider how your surgical site will be marked or identified. What happens during the procedure?  To reduce your risk of infection:  Your health care team will wash or sanitize their hands.  Your skin will be washed with soap.  Hair may be removed from the surgical area.  An IV tube may be inserted into one of your veins.  You will be given one or more of the following:  A medicine to help you relax (sedative).  A medicine to make you fall asleep (general anesthetic).  A breathing tube will be placed in your mouth.  Your surgeon will make several small cuts (incisions) in your abdomen.  The laparoscope will be inserted through one of the small incisions. The camera on the laparoscope will send images to a TV screen (monitor) in the operating room. This lets your surgeon see inside your abdomen.  Air-like gas will be pumped into your abdomen. This will expand your abdomen to give the surgeon more room to perform the surgery.  Other tools that are needed for the procedure will be inserted through the other incisions. The gallbladder will be removed through one of the incisions.  Your common bile duct may be examined. If stones are found in the common bile duct, they may be removed.  After your gallbladder has been removed, the incisions will be closed with stitches (sutures), staples, or skin glue.  Your incisions may be covered with a bandage  (dressing). The procedure may vary among health care providers and hospitals. What happens after the procedure?  Your blood pressure, heart rate, breathing rate, and blood oxygen level will be monitored until the medicines you were given have worn off.  You will be given medicines as needed to control your pain.  Do not drive for 24 hours if you were given a sedative. This information is not intended to replace advice given to you by your health care provider. Make sure you discuss any questions you have with your health care provider. Document Released: 09/05/2005 Document Revised: 03/27/2016 Document Reviewed: 02/22/2016 Elsevier Interactive Patient Education  2017 [employer]  Laparoscopic Cholecystectomy, Care After This sheet gives you information about how to care for yourself after your procedure. Your health care provider may also give you more specific instructions. If you have problems or questions, contact your health care provider. What can I expect after the procedure? After the procedure, it is common to have:  Pain at your incision sites. You will be given medicines to control this pain.  Mild nausea  or vomiting.  Bloating and possible shoulder pain from the air-like gas that was used during the procedure. Follow these instructions at home: Incision care  Follow instructions from your health care provider about how to take care of your incisions. Make sure you:  Wash your hands with soap and water before you change your bandage (dressing). If soap and water are not available, use hand sanitizer.  Change your dressing as told by your health care provider.  Leave stitches (sutures), skin glue, or adhesive strips in place. These skin closures may need to be in place for 2 weeks or longer. If adhesive strip edges start to loosen and curl up, you may trim the loose edges. Do not remove adhesive strips completely unless your health care provider tells you to do that.  Do  not take baths, swim, or use a hot tub until your health care provider approves. Ask your health care provider if you can take showers. You may only be allowed to take sponge baths for bathing.  Check your incision area every day for signs of infection. Check for:  More redness, swelling, or pain.  More fluid or blood.  Warmth.  Pus or a bad smell. Activity  Do not drive or use heavy machinery while taking prescription pain medicine.  Do not lift anything that is heavier than 10 lb (4.5 kg) until your health care provider approves.  Do not play contact sports until your health care provider approves.  Do not drive for 24 hours if you were given a medicine to help you relax (sedative).  Rest as needed. Do not return to work or school until your health care provider approves. General instructions  Take over-the-counter and prescription medicines only as told by your health care provider.  To prevent or treat constipation while you are taking prescription pain medicine, your health care provider may recommend that you:  Drink enough fluid to keep your urine clear or pale yellow.  Take over-the-counter or prescription medicines.  Eat foods that are high in fiber, such as fresh fruits and vegetables, whole grains, and beans.  Limit foods that are high in fat and processed sugars, such as fried and sweet foods. Contact a health care provider if:  You develop a rash.  You have more redness, swelling, or pain around your incisions.  You have more fluid or blood coming from your incisions.  Your incisions feel warm to the touch.  You have pus or a bad smell coming from your incisions.  You have a fever.  One or more of your incisions breaks open. Get help right away if:  You have trouble breathing.  You have chest pain.  You have increasing pain in your shoulders.  You faint or feel dizzy when you stand.  You have severe pain in your abdomen.  You have nausea or  vomiting that lasts for more than one day.  You have leg pain. This information is not intended to replace advice given to you by your health care provider. Make sure you discuss any questions you have with your health care provider. Document Released: 09/05/2005 Document Revised: 03/26/2016 Document Reviewed: 02/22/2016 Elsevier Interactive Patient Education  2017 [place].  Liver Biopsy The liver is a large organ in the upper right-hand side of your abdomen. A liver biopsy is a procedure in which a tissue sample is taken from the liver and examined under a microscope. The procedure is done to confirm a suspected problem. There are three types  of liver biopsies:  Percutaneous. In this type, an incision is made in your abdomen. The sample is removed through the incision with a needle.  Laparoscopic. In this type, several incisions are made in the abdomen. A tiny camera is passed through one of the incisions to help guide the health care provider. The sample is removed through the other incision or incisions.  Transjugular. In this type, an incision is made in the neck. A tube is passed through the incision to the liver. The sample is removed through the tube with a needle. Tell a health care provider about:  Any allergies you have.  All medicines you are taking, including vitamins, herbs, eye drops, creams, and over-the-counter medicines.  Any problems you or family members have had with anesthetic medicines.  Any blood disorders you have.  Any surgeries you have had.  Any medical conditions you have.  Possibility of pregnancy, if this applies. What are the risks? Generally, this is a safe procedure. However, problems can occur and include:  Bleeding.  Infection.  Bruising.  Collapsed lung.  Leak of digestive juices (bile) from the liver or gallbladder.  Problems with heart rhythm.  Pain at the biopsy site or in the right shoulder.  Low blood pressure  (hypotension).  Injury to nearby organs or tissues. What happens before the procedure?  Your health care provider may do some blood or urine tests. These will help your health care provider learn how well your kidneys and liver are working and how well your blood clots.  Ask your health care provider if you will be able to go home the day of the procedure. Arrange for someone to take you home and stay with you for at least 24 hours.  Do not eat or drink anything after midnight on the night before the procedure or as directed by your health care provider.  Ask your health care provider about:  Changing or stopping your regular medicines. This is especially important if you are taking diabetes medicines or blood thinners.  Taking medicines such as aspirin and ibuprofen. These medicines can thin your blood. Do not take these medicines before your procedure if your health care provider asks you not to. What happens during the procedure? Regardless of the type of biopsy that will be done, you will have an IV line placed. Through this line, you will receive fluids and medicine to relax you. If you will be having a laparoscopic biopsy, you may also receive medicine through this line to make you sleep during the procedure (general anesthetic). Percutaneous Liver Biopsy  You will positioned on your back, with your right hand over your head.  A health care provider will locate your liver by tapping and pressing on the right side of your abdomen or with the help of an ultrasound machine or CT scan.  An area at the bottom of your last right rib will be numbed.  An incision will be made in the numbed area.  The biopsy needle will be inserted into the incision.  Several samples of liver tissue will be taken with the biopsy needle. You will be asked to hold your breath as each sample is taken. Laparoscopic Liver Biopsy  You will be positioned on your back.  Several small incisions will be made in  your abdomen.  Your doctor will pass a tiny camera through one incision. The camera will allow the liver to be viewed on a TV monitor in the operating room.  Tools will be  passed through the other incision or incisions. These tools will be used to remove samples of liver tissue. Transjugular Liver Biopsy  You will be positioned on your back on an X-ray table, with your head turned to your left.  An area on your neck just over your jugular vein will be numbed.  An incision will be made in the numbed area.  A tiny tube will be inserted through the incision. It will be pushed through the jugular vein to a blood vessel in the liver called the hepatic vein.  Dye will be inserted through the tube, and X-rays will be taken. The dye will make the blood vessels in the liver light up on the X-rays.  The biopsy needle will be pushed through the tube until it reaches the liver.  Samples of liver tissue will be taken with the biopsy needle.  The needle and the tube will be removed. After the samples are obtained, the incision or incisions will be closed. What happens after the procedure?  You will be taken to a recovery area.  You may have to lie on your right side for 1-2 hours. This will prevent bleeding from the biopsy site.  Your progress will be watched. Your blood pressure, pulse, and the biopsy site will be checked often.  You may have some pain or feel sick. If this happens, tell your health care provider.  As you begin to feel better, you will be offered ice and beverages.  You may be allowed to go home when the medicines have worn off and you can walk, drink, eat, and use the bathroom. This information is not intended to replace advice given to you by your health care provider. Make sure you discuss any questions you have with your health care provider. Document Released: 11/26/2003 Document Revised: 02/08/2016 Document Reviewed: 11/01/2013 Elsevier Interactive Patient Education   2017 [place].  Liver Biopsy, Care After Introduction These instructions give you information on caring for yourself after your procedure. Your doctor may also give you more specific instructions. Call your doctor if you have any problems or questions after your procedure. Follow these instructions at home:  Rest at home for 1-2 days or as told by your doctor.  Have someone stay with you for at least 24 hours.  Do not do these things in the first 24 hours:  Drive.  Use machinery.  Take care of other people.  Sign legal documents.  Take a bath or shower.  There are many different ways to close and cover a cut (incision). For example, a cut can be closed with stitches, skin glue, or adhesive strips. Follow your doctor's instructions on:  Taking care of your cut.  Changing and removing your bandage (dressing).  Removing whatever was used to close your cut.  Do not drink alcohol in the first week.  Do not lift more than 5 pounds or play contact sports for the first 2 weeks.  Take medicines only as told by your doctor. For 1 week, do not take medicine that has aspirin in it or medicines like ibuprofen.  Get your test results. Contact a doctor if:  A cut bleeds and leaves more than just a small spot of blood.  A cut is red, puffs up (swells), or hurts more than before.  Fluid or something else comes from a cut.  A cut smells bad.  You have a fever or chills. Get help right away if:  You have swelling, bloating, or pain in  your belly (abdomen).  You get dizzy or faint.  You have a rash.  You feel sick to your stomach (nauseous) or throw up (vomit).  You have trouble breathing, feel short of breath, or feel faint.  Your chest hurts.  You have problems talking or seeing.  You have trouble balancing or moving your arms or legs. This information is not intended to replace advice given to you by your health care provider. Make sure you discuss any questions  you have with your health care provider. Document Released: 06/14/2008 Document Revised: 02/11/2016 Document Reviewed: 11/01/2013  2017 Elsevier  General Anesthesia, Adult General anesthesia is the use of medicines to make a person "go to sleep" (be unconscious) for a medical procedure. General anesthesia is often recommended when a procedure:  Is long.  Requires you to be still or in an unusual position.  Is major and can cause you to lose blood.  Is impossible to do without general anesthesia. The medicines used for general anesthesia are called general anesthetics. In addition to making you sleep, the medicines:  Prevent pain.  Control your blood pressure.  Relax your muscles. Tell a health care provider about:  Any allergies you have.  All medicines you are taking, including vitamins, herbs, eye drops, creams, and over-the-counter medicines.  Any problems you or family members have had with anesthetic medicines.  Types of anesthetics you have had in the past.  Any bleeding disorders you have.  Any surgeries you have had.  Any medical conditions you have.  Any history of heart or lung conditions, such as heart failure, sleep apnea, or chronic obstructive pulmonary disease (COPD).  Whether you are pregnant or may be pregnant.  Whether you use tobacco, alcohol, marijuana, or street drugs.  Any history of Armed forces logistics/support/administrative officer.  Any history of depression or anxiety. What are the risks? Generally, this is a safe procedure. However, problems may occur, including:  Allergic reaction to anesthetics.  Lung and heart problems.  Inhaling food or liquids from your stomach into your lungs (aspiration).  Injury to nerves.  Waking up during your procedure and being unable to move (rare).  Extreme agitation or a state of mental confusion (delirium) when you wake up from the anesthetic.  Air in the bloodstream, which can lead to stroke. These problems are more likely to  develop if you are having a major surgery or if you have an advanced medical condition. You can prevent some of these complications by answering all of your health care provider's questions thoroughly and by following all pre-procedure instructions. General anesthesia can cause side effects, including:  Nausea or vomiting  A sore throat from the breathing tube.  Feeling cold or shivery.  Feeling tired, washed out, or achy.  Sleepiness or drowsiness.  Confusion or agitation. What happens before the procedure? Staying hydrated  Follow instructions from your health care provider about hydration, which may include:  Up to 2 hours before the procedure - you may continue to drink clear liquids, such as water, clear fruit juice, black coffee, and plain tea. Eating and drinking restrictions  Follow instructions from your health care provider about eating and drinking, which may include:  8 hours before the procedure - stop eating heavy meals or foods such as meat, fried foods, or fatty foods.  6 hours before the procedure - stop eating light meals or foods, such as toast or cereal.  6 hours before the procedure - stop drinking milk or drinks that contain milk.  2 hours before the procedure - stop drinking clear liquids. Medicines  Ask your health care provider about:  Changing or stopping your regular medicines. This is especially important if you are taking diabetes medicines or blood thinners.  Taking medicines such as aspirin and ibuprofen. These medicines can thin your blood. Do not take these medicines before your procedure if your health care provider instructs you not to.  Taking new dietary supplements or medicines. Do not take these during the week before your procedure unless your health care provider approves them.  If you are told to take a medicine or to continue taking a medicine on the day of the procedure, take the medicine with sips of water. General instructions    Ask if you will be going home the same day, the following day, or after a longer hospital stay.  Plan to have someone take you home.  Plan to have someone stay with you for the first 24 hours after you leave the hospital or clinic.  For 3-6 weeks before the procedure, try not to use any tobacco products, such as cigarettes, chewing tobacco, and e-cigarettes.  You may brush your teeth on the morning of the procedure, but make sure to spit out the toothpaste. What happens during the procedure?  You will be given anesthetics through a mask and through an IV tube in one of your veins.  You may receive medicine to help you relax (sedative).  As soon as you are asleep, a breathing tube may be used to help you breathe.  An anesthesia specialist will stay with you throughout the procedure. He or she will help keep you comfortable and safe by continuing to give you medicines and adjusting the amount of medicine that you get. He or she will also watch your blood pressure, pulse, and oxygen levels to make sure that the anesthetics do not cause any problems.  If a breathing tube was used to help you breathe, it will be removed before you wake up. The procedure may vary among health care providers and hospitals. What happens after the procedure?  You will wake up, often slowly, after the procedure is complete, usually in a recovery area.  Your blood pressure, heart rate, breathing rate, and blood oxygen level will be monitored until the medicines you were given have worn off.  You may be given medicine to help you calm down if you feel anxious or agitated.  If you will be going home the same day, your health care provider may check to make sure you can stand, drink, and urinate.  Your health care providers will treat your pain and side effects before you go home.  Do not drive for 24 hours if you received a sedative.  You may:  Feel nauseous and vomit.  Have a sore throat.  Have  mental slowness.  Feel cold or shivery.  Feel sleepy.  Feel tired.  Feel sore or achy, even in parts of your body where you did not have surgery. This information is not intended to replace advice given to you by your health care provider. Make sure you discuss any questions you have with your health care provider. Document Released: 12/13/2007 Document Revised: 02/16/2016 Document Reviewed: 08/20/2015 Elsevier Interactive Patient Education  2017 [place] Anesthesia, Adult, Care After These instructions provide you with information about caring for yourself after your procedure. Your health care provider may also give you more specific instructions. Your treatment has been planned according to current medical  practices, but problems sometimes occur. Call your health care provider if you have any problems or questions after your procedure. What can I expect after the procedure? After the procedure, it is common to have:  Vomiting.  A sore throat.  Mental slowness. It is common to feel:  Nauseous.  Cold or shivery.  Sleepy.  Tired.  Sore or achy, even in parts of your body where you did not have surgery. Follow these instructions at home: For at least 24 hours after the procedure:  Do not:  Participate in activities where you could fall or become injured.  Drive.  Use heavy machinery.  Drink alcohol.  Take sleeping pills or medicines that cause drowsiness.  Make important decisions or sign legal documents.  Take care of children on your own.  Rest. Eating and drinking  If you vomit, drink water, juice, or soup when you can drink without vomiting.  Drink enough fluid to keep your urine clear or pale yellow.  Make sure you have little or no nausea before eating solid foods.  Follow the diet recommended by your health care provider. General instructions  Have a responsible adult stay with you until you are awake and alert.  Return to your  normal activities as told by your health care provider. Ask your health care provider what activities are safe for you.  Take over-the-counter and prescription medicines only as told by your health care provider.  If you smoke, do not smoke without supervision.  Keep all follow-up visits as told by your health care provider. This is important. Contact a health care provider if:  You continue to have nausea or vomiting at home, and medicines are not helpful.  You cannot drink fluids or start eating again.  You cannot urinate after 8-12 hours.  You develop a skin rash.  You have fever.  You have increasing redness at the site of your procedure. Get help right away if:  You have difficulty breathing.  You have chest pain.  You have unexpected bleeding.  You feel that you are having a life-threatening or urgent problem. This information is not intended to replace advice given to you by your health care provider. Make sure you discuss any questions you have with your health care provider. Document Released: 12/12/2000 Document Revised: 02/08/2016 Document Reviewed: 08/20/2015 Elsevier Interactive Patient Education  2017 [employer] PATIENT INSTRUCTIONS POST-ANESTHESIA  IMMEDIATELY FOLLOWING SURGERY:  Do not drive or operate machinery for the first twenty four hours after surgery.  Do not make any important decisions for twenty four hours after surgery or while taking narcotic pain medications or sedatives.  If you develop intractable nausea and vomiting or a severe headache please notify your doctor immediately.  FOLLOW-UP:  Please make an appointment with your surgeon as instructed. You do not need to follow up with anesthesia unless specifically instructed to do so.  WOUND CARE INSTRUCTIONS (if applicable):  Keep a dry clean dressing on the anesthesia/puncture wound site if there is drainage.  Once the wound has quit draining you may leave it open to air.  Generally you should  leave the bandage intact for twenty four hours unless there is drainage.  If the epidural site drains for more than 36-48 hours please call the anesthesia department.  QUESTIONS?:  Please feel free to call your physician or the hospital operator if you have any questions, and they will be happy to assist you.

## 2016-10-21 ENCOUNTER — Encounter (HOSPITAL_COMMUNITY): Payer: Self-pay

## 2016-10-21 ENCOUNTER — Encounter (HOSPITAL_COMMUNITY)
Admission: RE | Admit: 2016-10-21 | Discharge: 2016-10-21 | Disposition: A | Discharge status: Home / Self Care | Payer: 59 | Source: Ambulatory Visit | Attending: General Surgery | Admitting: General Surgery

## 2016-10-21 DIAGNOSIS — Z79899 Other long term (current) drug therapy: Secondary | ICD-10-CM | POA: Diagnosis not present

## 2016-10-21 DIAGNOSIS — Z01818 Encounter for other preprocedural examination: Secondary | ICD-10-CM

## 2016-10-21 DIAGNOSIS — E039 Hypothyroidism, unspecified: Secondary | ICD-10-CM | POA: Diagnosis not present

## 2016-10-21 DIAGNOSIS — K811 Chronic cholecystitis: Secondary | ICD-10-CM | POA: Diagnosis present

## 2016-10-21 DIAGNOSIS — K760 Fatty (change of) liver, not elsewhere classified: Secondary | ICD-10-CM | POA: Diagnosis not present

## 2016-10-21 DIAGNOSIS — K8010 Calculus of gallbladder with chronic cholecystitis without obstruction: Secondary | ICD-10-CM | POA: Diagnosis not present

## 2016-10-21 DIAGNOSIS — Z6841 Body Mass Index (BMI) 40.0 and over, adult: Secondary | ICD-10-CM | POA: Diagnosis not present

## 2016-10-21 HISTORY — DX: Gastro-esophageal reflux disease without esophagitis: K21.9

## 2016-10-21 LAB — BASIC METABOLIC PANEL
Anion gap: 7 (ref 5–15)
BUN: 11 mg/dL (ref 6–20)
CALCIUM: 8.9 mg/dL (ref 8.9–10.3)
CO2: 26 mmol/L (ref 22–32)
CREATININE: 0.78 mg/dL (ref 0.44–1.00)
Chloride: 103 mmol/L (ref 101–111)
GFR calc non Af Amer: >60 mL/min (ref >60)
Glucose, Bld: 131 mg/dL — ABNORMAL HIGH (ref 65–99)
Potassium: 3.6 mmol/L (ref 3.5–5.1)
SODIUM: 136 mmol/L (ref 135–145)

## 2016-10-21 LAB — CBC WITH DIFFERENTIAL/PLATELET
BASOS PCT: 0 %
Basophils Absolute: 0 10*3/uL (ref 0.0–0.1)
EOS ABS: 0.3 10*3/uL (ref 0.0–0.7)
EOS PCT: 3 %
HCT: 37.9 % (ref 36.0–46.0)
HEMOGLOBIN: 12.4 g/dL (ref 12.0–15.0)
LYMPHS ABS: 1.9 10*3/uL (ref 0.7–4.0)
Lymphocytes Relative: 22 %
MCH: 27.7 pg (ref 26.0–34.0)
MCHC: 32.7 g/dL (ref 30.0–36.0)
MCV: 84.8 fL (ref 78.0–100.0)
MONOS PCT: 8 %
Monocytes Absolute: 0.7 10*3/uL (ref 0.1–1.0)
Neutro Abs: 6 10*3/uL (ref 1.7–7.7)
Neutrophils Relative %: 67 %
PLATELETS: 307 10*3/uL (ref 150–400)
RBC: 4.47 MIL/uL (ref 3.87–5.11)
RDW: 14.5 % (ref 11.5–15.5)
WBC: 8.9 10*3/uL (ref 4.0–10.5)

## 2016-10-21 NOTE — H&P (Signed)
  NTS SOAP Note  Vital Signs:  Vitals as of: [id number]: Systolic [id number]: Diastolic 82: Heart Rate 67: Temp 98.88F (Temporal): Height 36ft 4in: Weight 246Lbs 0 Ounces: BMI 42.23   BMI : 42.23 kg/m2  Subjective: This 61 year old female presents for of  biliary colic and elevated liver enzyme test.  Patient has had intermittent episodes of right upper quadrant abdominal pain, nausea, bloating, fatty food intolerance for many months.  She has had extensive workup by Dr. Oneida Alar of GI and is now referred for a laparoscopic cholecystectomy as well as liver biopsy.  She has had chronically elevated liver enzyme tests which are not felt to be related to her gallbladder.  She states the symptoms were mild in nature until the last few weeks when the pain is now a 4/10 on occasion.  She currently has no right upper quadrant abdominal pain.  She denies any fever, chills, or jaundice.  She does have some fatty food intolerance, but this seems to be occurring more all foods.  Review of Symptoms:  Constitutional:negative Head:negative Eyes:negative Nose/Mouth/Throat:negative Cardiovascular:negative Respiratory:negative Gastrointestinabdominal pain Genitourinary:negative Musculoskeletal:negative Skin:negative Hematolgic/Lymphatic:negative Allergic/Immunologic:negative   Past Medical History:Reviewed  Past Medical History  Surgical History:  hysterectomy Medical Problems:  elevated liver enzyme tests, hypothyroidism,, pernicious anemia Allergies:  latex, possibly Betadine contact dermatitis Medications:  levothyroxine, omeprazole, simethicone, Zegerid, B12 injections   Social History:Reviewed  Social History  Preferred Language: English Race:  White Ethnicity: Not Hispanic / Latino Age: 62 year Marital Status:  M Alcohol: no   Smoking Status: Never smoker reviewed on 10/20/2016 Functional Status reviewed on  10/20/2016 ------------------------------------------------ Bathing: Normal Cooking: Normal Dressing: Normal Driving: Normal Eating: Normal Managing Meds: Normal Oral Care: Normal Shopping: Normal Toileting: Normal Transferring: Normal Walking: Normal Cognitive Status reviewed on 10/20/2016 ------------------------------------------------ Attention: Normal Decision Making: Normal Language: Normal Memory: Normal Motor: Normal Perception: Normal Problem Solving: Normal Visual and Spatial: Normal   Family History:Reviewed  Family Health History Family History is Unknown    Objective Information: General:Well appearing, well nourished in no distress. Skin:no rash or prominent lesions Head:Atraumatic; no masses; no abnormalities   No scleral icterus Neck:Supple without lymphadenopathy.  Heart:RRR, no murmur or gallop.  Normal S1, S2.  No S3, S4.  Lungs:CTA bilaterally, no wheezes, rhonchi, rales.  Breathing unlabored. Abdomen:Soft, NT/ND, normal bowel sounds, no HSM, no masses.  No peritoneal signs.  Dr. Oneida Alar note reviewed.   ultrasound shows gallbladder sludge, normal common bile duct.  HIDA scan shows low gallbladder ejection fraction. Assessment:  Chronic cholecystitis, elevated liver enzyme tests  Diagnoses: 575.11  K81.1 Chronic cholecystitis without calculus (Chronic cholecystitis)  Procedures: [id number] - OFFICE OUTPATIENT NEW 30 MINUTES    Plan:   patient is scheduled for laparoscopic cholecystectomy, liver biopsy on 10/24/16.   Patient Education:Alternative treatments to surgery were discussed with patient (and family).Risks and benefits  of procedure including bleeding, infection, hepatobiliary injury, and the possibility of an open procedure were fully explained to the patient (and family) who gave informed consent. Patient/family questions were addressed.  Follow-up:Pending Surgery

## 2016-10-24 ENCOUNTER — Ambulatory Visit (HOSPITAL_COMMUNITY): Payer: 59 | Admitting: Anesthesiology

## 2016-10-24 ENCOUNTER — Encounter: Payer: Self-pay | Admitting: Gastroenterology

## 2016-10-24 ENCOUNTER — Encounter (HOSPITAL_COMMUNITY): Payer: Self-pay | Admitting: *Deleted

## 2016-10-24 ENCOUNTER — Encounter (HOSPITAL_COMMUNITY)
Admission: RE | Disposition: A | Discharge status: Home / Self Care | Payer: Self-pay | Source: Ambulatory Visit | Attending: General Surgery

## 2016-10-24 ENCOUNTER — Ambulatory Visit (HOSPITAL_COMMUNITY)
Admission: RE | Admit: 2016-10-24 | Discharge: 2016-10-24 | Disposition: A | Discharge status: Home / Self Care | Payer: 59 | Source: Ambulatory Visit | Attending: General Surgery | Admitting: General Surgery

## 2016-10-24 DIAGNOSIS — R945 Abnormal results of liver function studies: Secondary | ICD-10-CM | POA: Diagnosis not present

## 2016-10-24 DIAGNOSIS — Z6841 Body Mass Index (BMI) 40.0 and over, adult: Secondary | ICD-10-CM | POA: Insufficient documentation

## 2016-10-24 DIAGNOSIS — K811 Chronic cholecystitis: Secondary | ICD-10-CM | POA: Diagnosis not present

## 2016-10-24 DIAGNOSIS — K760 Fatty (change of) liver, not elsewhere classified: Secondary | ICD-10-CM | POA: Insufficient documentation

## 2016-10-24 DIAGNOSIS — K8010 Calculus of gallbladder with chronic cholecystitis without obstruction: Secondary | ICD-10-CM | POA: Diagnosis not present

## 2016-10-24 DIAGNOSIS — Z79899 Other long term (current) drug therapy: Secondary | ICD-10-CM | POA: Insufficient documentation

## 2016-10-24 DIAGNOSIS — E039 Hypothyroidism, unspecified: Secondary | ICD-10-CM | POA: Insufficient documentation

## 2016-10-24 HISTORY — PX: LIVER BIOPSY: SHX301

## 2016-10-24 HISTORY — PX: CHOLECYSTECTOMY: SHX55

## 2016-10-24 SURGERY — LAPAROSCOPIC CHOLECYSTECTOMY
Anesthesia: General | Site: Abdomen

## 2016-10-24 MED ORDER — PROPOFOL 10 MG/ML IV BOLUS
INTRAVENOUS | Status: DC | PRN
  Administered 2016-10-24: 50 mg via INTRAVENOUS
  Administered 2016-10-24: 200 mg via INTRAVENOUS

## 2016-10-24 MED ORDER — FENTANYL CITRATE (PF) 250 MCG/5ML IJ SOLN
INTRAMUSCULAR | Status: AC
  Filled 2016-10-24: qty 5

## 2016-10-24 MED ORDER — BACITRACIN ZINC 500 UNIT/GM EX OINT
TOPICAL_OINTMENT | CUTANEOUS | Status: DC | PRN
  Administered 2016-10-24: 1 via TOPICAL

## 2016-10-24 MED ORDER — SUCCINYLCHOLINE CHLORIDE 20 MG/ML IJ SOLN
INTRAMUSCULAR | Status: AC
  Filled 2016-10-24: qty 1

## 2016-10-24 MED ORDER — LACTATED RINGERS IV SOLN: INTRAVENOUS | Status: DC

## 2016-10-24 MED ORDER — MIDAZOLAM HCL 2 MG/2ML IJ SOLN
1.0000 mg | INTRAMUSCULAR | Status: DC | PRN
  Administered 2016-10-24: 2 mg via INTRAVENOUS

## 2016-10-24 MED ORDER — HEMOSTATIC AGENTS (NO CHARGE) OPTIME
TOPICAL | Status: DC | PRN
  Administered 2016-10-24: 1 via TOPICAL

## 2016-10-24 MED ORDER — GLYCOPYRROLATE 0.2 MG/ML IJ SOLN
INTRAMUSCULAR | Status: DC | PRN
  Administered 2016-10-24: 0.2 mg via INTRAVENOUS

## 2016-10-24 MED ORDER — ONDANSETRON HCL 4 MG/2ML IJ SOLN
INTRAMUSCULAR | Status: AC
  Filled 2016-10-24: qty 2

## 2016-10-24 MED ORDER — SUGAMMADEX SODIUM 500 MG/5ML IV SOLN
INTRAVENOUS | Status: AC
  Filled 2016-10-24: qty 5

## 2016-10-24 MED ORDER — 0.9 % SODIUM CHLORIDE (POUR BTL) OPTIME
TOPICAL | Status: DC | PRN
  Administered 2016-10-24: 1000 mL

## 2016-10-24 MED ORDER — PROPOFOL 10 MG/ML IV BOLUS
INTRAVENOUS | Status: AC
  Filled 2016-10-24: qty 20

## 2016-10-24 MED ORDER — SUGAMMADEX SODIUM 200 MG/2ML IV SOLN
INTRAVENOUS | Status: DC | PRN
  Administered 2016-10-24: 500 mg via INTRAVENOUS

## 2016-10-24 MED ORDER — KETOROLAC TROMETHAMINE 30 MG/ML IJ SOLN
30.0000 mg | Freq: Once | INTRAMUSCULAR | Status: AC
  Administered 2016-10-24: 30 mg via INTRAVENOUS
  Filled 2016-10-24: qty 1

## 2016-10-24 MED ORDER — CHLORHEXIDINE GLUCONATE CLOTH 2 % EX PADS: 6.0000 | MEDICATED_PAD | Freq: Once | CUTANEOUS | Status: DC

## 2016-10-24 MED ORDER — POVIDONE-IODINE 10 % EX OINT
TOPICAL_OINTMENT | CUTANEOUS | Status: AC
Start: 2016-10-24 — End: ?
  Filled 2016-10-24: qty 1

## 2016-10-24 MED ORDER — GLYCOPYRROLATE 0.2 MG/ML IJ SOLN
INTRAMUSCULAR | Status: AC
  Filled 2016-10-24: qty 1

## 2016-10-24 MED ORDER — HYDROMORPHONE HCL 1 MG/ML IJ SOLN
0.2500 mg | INTRAMUSCULAR | Status: DC | PRN
  Administered 2016-10-24: 0.5 mg via INTRAVENOUS
  Filled 2016-10-24: qty 0.5

## 2016-10-24 MED ORDER — MIDAZOLAM HCL 2 MG/2ML IJ SOLN
INTRAMUSCULAR | Status: AC
  Filled 2016-10-24: qty 2

## 2016-10-24 MED ORDER — ONDANSETRON HCL 4 MG/2ML IJ SOLN
4.0000 mg | Freq: Once | INTRAMUSCULAR | Status: AC
Start: 2016-10-24 — End: 2016-10-24
  Administered 2016-10-24: 4 mg via INTRAVENOUS

## 2016-10-24 MED ORDER — CIPROFLOXACIN IN D5W 400 MG/200ML IV SOLN
INTRAVENOUS | Status: AC
  Filled 2016-10-24: qty 200

## 2016-10-24 MED ORDER — LACTATED RINGERS IV SOLN
INTRAVENOUS | Status: DC | PRN
  Administered 2016-10-24: 07:00:00 via INTRAVENOUS

## 2016-10-24 MED ORDER — LACTATED RINGERS IV SOLN
INTRAVENOUS | Status: DC
  Administered 2016-10-24: 1000 mL via INTRAVENOUS

## 2016-10-24 MED ORDER — ONDANSETRON HCL 4 MG/2ML IJ SOLN
4.0000 mg | Freq: Once | INTRAMUSCULAR | Status: DC
Start: 2016-10-24 — End: 2016-10-26

## 2016-10-24 MED ORDER — TRAMADOL HCL 50 MG PO TABS: 50.0000 mg | ORAL_TABLET | Freq: Four times a day (QID) | ORAL | 0 refills | Status: DC | PRN

## 2016-10-24 MED ORDER — CIPROFLOXACIN IN D5W 400 MG/200ML IV SOLN: 400.0000 mg | INTRAVENOUS | Status: AC

## 2016-10-24 MED ORDER — BACITRACIN ZINC 500 UNIT/GM EX OINT
TOPICAL_OINTMENT | CUTANEOUS | Status: AC
  Filled 2016-10-24: qty 0.9

## 2016-10-24 MED ORDER — ROCURONIUM BROMIDE 100 MG/10ML IV SOLN
INTRAVENOUS | Status: DC | PRN
  Administered 2016-10-24: 40 mg via INTRAVENOUS

## 2016-10-24 MED ORDER — FENTANYL CITRATE (PF) 100 MCG/2ML IJ SOLN
INTRAMUSCULAR | Status: DC | PRN
  Administered 2016-10-24 (×2): 100 ug via INTRAVENOUS
  Administered 2016-10-24: 50 ug via INTRAVENOUS

## 2016-10-24 MED ORDER — MIDAZOLAM HCL 2 MG/2ML IJ SOLN: 1.0000 mg | INTRAMUSCULAR | Status: DC | PRN

## 2016-10-24 MED ORDER — SUCCINYLCHOLINE CHLORIDE 20 MG/ML IJ SOLN
INTRAMUSCULAR | Status: DC | PRN
  Administered 2016-10-24: 120 mg via INTRAVENOUS

## 2016-10-24 MED ORDER — ONDANSETRON 4 MG PO TBDP: 4.0000 mg | ORAL_TABLET | Freq: Three times a day (TID) | ORAL | 0 refills | Status: DC | PRN

## 2016-10-24 MED ORDER — ROCURONIUM BROMIDE 50 MG/5ML IV SOLN
INTRAVENOUS | Status: AC
Start: 2016-10-24 — End: ?
  Filled 2016-10-24: qty 1

## 2016-10-24 MED ORDER — BUPIVACAINE HCL (PF) 0.5 % IJ SOLN
INTRAMUSCULAR | Status: AC
  Filled 2016-10-24: qty 30

## 2016-10-24 MED ORDER — BUPIVACAINE HCL (PF) 0.5 % IJ SOLN
INTRAMUSCULAR | Status: DC | PRN
  Administered 2016-10-24: 10 mL

## 2016-10-24 SURGICAL SUPPLY — 51 items
APPLIER CLIP LAPSCP 10X32 DD (CLIP) ×3 IMPLANT
BAG HAMPER (MISCELLANEOUS) ×3 IMPLANT
BAG SPEC RTRVL LRG 6X4 10 (ENDOMECHANICALS) ×1
CHLORAPREP W/TINT 26ML (MISCELLANEOUS) ×5 IMPLANT
CLOSURE WOUND 1/4 X3 (GAUZE/BANDAGES/DRESSINGS) ×1
CLOTH BEACON ORANGE TIMEOUT ST (SAFETY) ×3 IMPLANT
COVER LIGHT HANDLE STERIS (MISCELLANEOUS) ×6 IMPLANT
DECANTER SPIKE VIAL GLASS SM (MISCELLANEOUS) ×3 IMPLANT
ELECT REM PT RETURN 9FT ADLT (ELECTROSURGICAL) ×3
ELECTRODE REM PT RTRN 9FT ADLT (ELECTROSURGICAL) ×1 IMPLANT
FILTER SMOKE EVAC LAPAROSHD (FILTER) ×3 IMPLANT
FORMALIN 10 PREFIL 120ML (MISCELLANEOUS) ×5 IMPLANT
GLOVE BIOGEL PI IND STRL 7.0 (GLOVE) ×1 IMPLANT
GLOVE BIOGEL PI INDICATOR 7.0 (GLOVE) ×2
GLOVE SURG SS PI 6.5 STRL IVOR (GLOVE) ×2 IMPLANT
GLOVE SURG SS PI 7.0 STRL IVOR (GLOVE) ×4 IMPLANT
GLOVE SURG SS PI 7.5 STRL IVOR (GLOVE) ×6 IMPLANT
GOWN STRL REUS W/TWL LRG LVL3 (GOWN DISPOSABLE) ×9 IMPLANT
HEMOSTAT SNOW SURGICEL 2X4 (HEMOSTASIS) ×3 IMPLANT
INST SET LAPROSCOPIC AP (KITS) ×3 IMPLANT
IV NS IRRIG 3000ML ARTHROMATIC (IV SOLUTION) IMPLANT
KIT ROOM TURNOVER APOR (KITS) ×3 IMPLANT
MANIFOLD NEPTUNE II (INSTRUMENTS) ×3 IMPLANT
NDL BIOPSY 14GX4.5 SOFT TIS (NEEDLE) IMPLANT
NDL HYPO 25X1 1.5 SAFETY (NEEDLE) ×1 IMPLANT
NDL INSUFFLATION 14GA 120MM (NEEDLE) ×1 IMPLANT
NEEDLE BIOPSY 14GX4.5 SOFT TIS (NEEDLE) ×3 IMPLANT
NEEDLE HYPO 25X1 1.5 SAFETY (NEEDLE) ×3 IMPLANT
NEEDLE INSUFFLATION 14GA 120MM (NEEDLE) ×3 IMPLANT
NS IRRIG 1000ML POUR BTL (IV SOLUTION) ×3 IMPLANT
PACK LAP CHOLE LZT030E (CUSTOM PROCEDURE TRAY) ×3 IMPLANT
PACK MINOR (CUSTOM PROCEDURE TRAY) ×3 IMPLANT
PAD ARMBOARD 7.5X6 YLW CONV (MISCELLANEOUS) ×3 IMPLANT
PAD TELFA 3X4 1S STER (GAUZE/BANDAGES/DRESSINGS) ×3 IMPLANT
POUCH SPECIMEN RETRIEVAL 10MM (ENDOMECHANICALS) ×3 IMPLANT
SET BASIN LINEN APH (SET/KITS/TRAYS/PACK) ×3 IMPLANT
SLEEVE ENDOPATH XCEL 5M (ENDOMECHANICALS) ×3 IMPLANT
SPONGE GAUZE 2X2 8PLY STER LF (GAUZE/BANDAGES/DRESSINGS) ×1
SPONGE GAUZE 2X2 8PLY STRL LF (GAUZE/BANDAGES/DRESSINGS) ×5 IMPLANT
STAPLER VISISTAT (STAPLE) ×3 IMPLANT
STRIP CLOSURE SKIN 1/4X3 (GAUZE/BANDAGES/DRESSINGS) ×2 IMPLANT
SUT VIC AB 4-0 PS2 27 (SUTURE) ×3 IMPLANT
SUT VICRYL 0 UR6 27IN ABS (SUTURE) ×3 IMPLANT
TAPE CLOTH SURG 4X10 WHT LF (GAUZE/BANDAGES/DRESSINGS) ×2 IMPLANT
TROCAR ENDO BLADELESS 11MM (ENDOMECHANICALS) ×3 IMPLANT
TROCAR XCEL NON-BLD 5MMX100MML (ENDOMECHANICALS) ×3 IMPLANT
TROCAR XCEL UNIV SLVE 11M 100M (ENDOMECHANICALS) ×3 IMPLANT
TUBE CONNECTING 12'X1/4 (SUCTIONS) ×1
TUBE CONNECTING 12X1/4 (SUCTIONS) ×2 IMPLANT
TUBING INSUFFLATION (TUBING) ×3 IMPLANT
WARMER LAPAROSCOPE (MISCELLANEOUS) ×3 IMPLANT

## 2016-10-24 NOTE — Transfer of Care (Signed)
Immediate Anesthesia Transfer of Care Note  Patient: Rachel Branch  Procedure(s) Performed: Procedure(s): LAPAROSCOPIC CHOLECYSTECTOMY (N/A) LIVER BIOPSY (N/A)  Patient Location: PACU  Anesthesia Type:General  Level of Consciousness: awake, alert  and oriented  Airway & Oxygen Therapy: Patient Spontanous Breathing and Patient connected to face mask oxygen  Post-op Assessment: Report given to RN and Post -op Vital signs reviewed and stable  Post vital signs: Reviewed and stable  Last Vitals:  Vitals:   10/24/16 0700 10/24/16 0715  BP: 118/76 113/64  Resp: 17 12  Temp:      Last Pain:  Vitals:   10/24/16 0639  TempSrc: Oral      Patients Stated Pain Goal: 9 ([id number])  Complications: No apparent anesthesia complications

## 2016-10-24 NOTE — Anesthesia Preprocedure Evaluation (Signed)
Anesthesia Evaluation  Patient identified by MRN, date of birth, ID band Patient awake    Reviewed: Allergy & Precautions, Patient's Chart, lab work & pertinent test results  Airway Mallampati: IV  TM Distance: >3 FB Neck ROM: Full  Mouth opening: Limited Mouth Opening  Dental no notable dental hx. (+) Teeth Intact, Dental Advisory Given   Pulmonary  Snores- ?undiagnosed OSA   Pulmonary exam normal breath sounds clear to auscultation       Cardiovascular negative cardio ROS Normal cardiovascular exam Rhythm:Regular Rate:Normal     Neuro/Psych negative neurological ROS  negative psych ROS   GI/Hepatic negative GI ROS, Neg liver ROS, GERD  ,Pernicious anemia   Endo/Other  Hypothyroidism Morbid obesity  Renal/GU negative Renal ROS  negative genitourinary   Musculoskeletal negative musculoskeletal ROS (+)   Abdominal   Peds  Hematology  (+) anemia ,   Anesthesia Other Findings   Reproductive/Obstetrics Endometrial polyp PMB                             Anesthesia Physical Anesthesia Plan  ASA: II  Anesthesia Plan: General   Post-op Pain Management:    Induction: Intravenous, Rapid sequence and Cricoid pressure planned  Airway Management Planned: Oral ETT and Video Laryngoscope Planned  Additional Equipment:   Intra-op Plan:   Post-operative Plan: Extubation in OR  Informed Consent: I have reviewed the patients History and Physical, chart, labs and discussed the procedure including the risks, benefits and alternatives for the proposed anesthesia with the patient or authorized representative who has indicated his/her understanding and acceptance.   Dental advisory given  Plan Discussed with: CRNA, Anesthesiologist and Surgeon  Anesthesia Plan Comments:         Anesthesia Quick Evaluation

## 2016-10-24 NOTE — Interval H&P Note (Signed)
History and Physical Interval Note:  10/24/2016 7:13 AM  [name]  has presented today for surgery, with the diagnosis of chronic cholecystitis, elevated liver function tests  The various methods of treatment have been discussed with the patient and family. After consideration of risks, benefits and other options for treatment, the patient has consented to  Procedure(s): LAPAROSCOPIC CHOLECYSTECTOMY (N/A) LIVER BIOPSY (N/A) as a surgical intervention .  The patient's history has been reviewed, patient examined, no change in status, stable for surgery.  I have reviewed the patient's chart and labs.  Questions were answered to the patient's satisfaction.     Aviva Signs A

## 2016-10-24 NOTE — Op Note (Signed)
Patient:  Rachel Branch  DOB:  November 13, 1955  MRN:  [id number]   Preop Diagnosis:  Chronic cholecystitis, elevated liver enzyme tests  Postop Diagnosis:  Same  Procedure:  Laparoscopic cholecystectomy, liver biopsy  Surgeon:  Aviva Signs, M.D.  Anes:  Gen. endotracheal  Indications:  Patient is a 61 year old white female who presents with chronic cholecystitis as well as elevated liver enzyme tests. The risks and benefits of the procedure including bleeding, infection, hepatobiliary injury, and the possibility of an open procedure were fully explained to the patient, who gave informed consent.  Procedure note:  The patient was placed in the supine position. After induction of general endotracheal anesthesia, the abdomen was prepped and draped using the usual sterile technique with ChloraPrep. Surgical site confirmation was performed.  A supraumbilical incision was made down to the fascia. A Veress needle was introduced into the abdominal cavity and confirmation of placement was done using the saline drop test. The abdomen was then insufflated to 16 mmHg pressure. An 11 mm trocar was introduced abdominal cavity under direct visualization without difficulty. The patient was placed in reverse Trendelenburg position and an additional 11 mm trocar was placed in the epigastric region and 5 mm trochars were placed in the right upper quadrant and right flank regions. The liver was inspected and appeared to be somewhat fatty in nature. A Tru-Cut needle biopsy of the right lobe of the liver was performed and sent to pathology. The gallbladder was retracted in a dynamic fashion in order to provide a critical view of the triangle of Calot. The cystic duct was first identified. Its juncture to the infundibulum was fully identified. Endoclips were placed proximally and distally on the cystic duct, and the cystic duct was divided. This was likewise done to the cystic artery. The gallbladder was freed away from the  gallbladder fossa using Bovie electrocautery. The gallbladder was delivered through the epigastric trocar site using an Endo Catch bag. The gallbladder was fossa was inspected and no abnormal bleeding or bile leakage was noted. Surgicel was placed in the gallbladder fossa. All fluid and air were then evacuated from the abdominal cavity prior to the removal of the trochars.  All wounds were irrigated with normal saline. All wounds were injected with 0.5% Sensorcaine. The supraumbilical fascia was reapproximated using an 0 Vicryl interrupted suture. All skin incisions were closed using staples. Bacitracin ointment and dry sterile dressings were applied.  All tape and needle counts were correct at the end of the procedure. The patient was extubated in the operating room and transferred to PACU in stable condition.  Complications:  None  EBL:  Minimal  Specimen:  Gallbladder, liver biopsy

## 2016-10-24 NOTE — Anesthesia Procedure Notes (Signed)
Date/Time: 10/24/2016 7:33 AM Performed by: Jonna Munro Pre-anesthesia Checklist: Patient identified, Emergency Drugs available, Suction available, Patient being monitored and Timeout performed Patient Re-evaluated:Patient Re-evaluated prior to inductionOxygen Delivery Method: Circle system utilized Preoxygenation: Pre-oxygenation with 100% oxygen Intubation Type: IV induction, Rapid sequence and Cricoid Pressure applied Laryngoscope Size: Glidescope and 3 Grade View: Grade I Tube type: Oral Tube size: 7.0 mm Number of attempts: 1 Airway Equipment and Method: Stylet Placement Confirmation: ETT inserted through vocal cords under direct vision,  positive ETCO2 and CO2 detector Secured at: 21 cm Tube secured with: Tape Dental Injury: Teeth and Oropharynx as per pre-operative assessment

## 2016-10-24 NOTE — Discharge Instructions (Signed)
Laparoscopic Cholecystectomy, Care After °This sheet gives you information about how to care for yourself after your procedure. Your health care provider may also give you more specific instructions. If you have problems or questions, contact your health care provider. °What can I expect after the procedure? °After the procedure, it is common to have: °· Pain at your incision sites. You will be given medicines to control this pain. °· Mild nausea or vomiting. °· Bloating and possible shoulder pain from the air-like gas that was used during the procedure. °Follow these instructions at home: °Incision care  ° °· Follow instructions from your health care provider about how to take care of your incisions. Make sure you: °¨ Wash your hands with soap and water before you change your bandage (dressing). If soap and water are not available, use hand sanitizer. °¨ Change your dressing as told by your health care provider. °¨ Leave stitches (sutures), skin glue, or adhesive strips in place. These skin closures may need to be in place for 2 weeks or longer. If adhesive strip edges start to loosen and curl up, you may trim the loose edges. Do not remove adhesive strips completely unless your health care provider tells you to do that. °· Do not take baths, swim, or use a hot tub until your health care provider approves. Ask your health care provider if you can take showers. You may only be allowed to take sponge baths for bathing. °· Check your incision area every day for signs of infection. Check for: °¨ More redness, swelling, or pain. °¨ More fluid or blood. °¨ Warmth. °¨ Pus or a bad smell. °Activity  °· Do not drive or use heavy machinery while taking prescription pain medicine. °· Do not lift anything that is heavier than 10 lb (4.5 kg) until your health care provider approves. °· Do not play contact sports until your health care provider approves. °· Do not drive for 24 hours if you were given a medicine to help you relax  (sedative). °· Rest as needed. Do not return to work or school until your health care provider approves. °General instructions  °· Take over-the-counter and prescription medicines only as told by your health care provider. °· To prevent or treat constipation while you are taking prescription pain medicine, your health care provider may recommend that you: °¨ Drink enough fluid to keep your urine clear or pale yellow. °¨ Take over-the-counter or prescription medicines. °¨ Eat foods that are high in fiber, such as fresh fruits and vegetables, whole grains, and beans. °¨ Limit foods that are high in fat and processed sugars, such as fried and sweet foods. °Contact a health care provider if: °· You develop a rash. °· You have more redness, swelling, or pain around your incisions. °· You have more fluid or blood coming from your incisions. °· Your incisions feel warm to the touch. °· You have pus or a bad smell coming from your incisions. °· You have a fever. °· One or more of your incisions breaks open. °Get help right away if: °· You have trouble breathing. °· You have chest pain. °· You have increasing pain in your shoulders. °· You faint or feel dizzy when you stand. °· You have severe pain in your abdomen. °· You have nausea or vomiting that lasts for more than one day. °· You have leg pain. °This information is not intended to replace advice given to you by your health care provider. Make sure you discuss any   questions you have with your health care provider. °Document Released: 09/05/2005 Document Revised: 03/26/2016 Document Reviewed: 02/22/2016 °Elsevier Interactive Patient Education © 2017 [employer] ° °

## 2016-10-25 NOTE — Anesthesia Postprocedure Evaluation (Signed)
Anesthesia Post Note  Patient: Rachel Branch  Procedure(s) Performed: Procedure(s) (LRB): LAPAROSCOPIC CHOLECYSTECTOMY (N/A) LIVER BIOPSY (N/A)  Patient location during evaluation: Nursing Unit Anesthesia Type: General Level of consciousness: awake Pain management: satisfactory to patient Vital Signs Assessment: post-procedure vital signs reviewed and stable Respiratory status: spontaneous breathing Cardiovascular status: stable Anesthetic complications: no     Last Vitals:  Vitals:   10/24/16 0930 10/24/16 0956  BP:  130/78  Pulse: 84 83  Resp: 12 16  Temp:  36.3 C    Last Pain:  Vitals:   10/24/16 0956  TempSrc: Oral  PainSc:                  Drucie Opitz

## 2016-10-27 ENCOUNTER — Encounter (HOSPITAL_COMMUNITY): Payer: Self-pay | Admitting: General Surgery

## 2016-11-10 ENCOUNTER — Ambulatory Visit: Payer: 59 | Admitting: Gastroenterology

## 2016-11-30 ENCOUNTER — Ambulatory Visit (INDEPENDENT_AMBULATORY_CARE_PROVIDER_SITE_OTHER): Payer: 59 | Admitting: Gastroenterology

## 2016-11-30 ENCOUNTER — Encounter: Payer: Self-pay | Admitting: Gastroenterology

## 2016-11-30 DIAGNOSIS — K760 Fatty (change of) liver, not elsewhere classified: Secondary | ICD-10-CM | POA: Diagnosis not present

## 2016-11-30 DIAGNOSIS — Z1211 Encounter for screening for malignant neoplasm of colon: Secondary | ICD-10-CM | POA: Diagnosis not present

## 2016-11-30 DIAGNOSIS — K8010 Calculus of gallbladder with chronic cholecystitis without obstruction: Secondary | ICD-10-CM

## 2016-11-30 NOTE — Assessment & Plan Note (Addendum)
HFP FEB 5. ASSOCIATED WITH ELEVATED LIVER ENZYMES.  REPEAT HFP TODAY. FOLLOW UP IN 6 MOS.

## 2016-11-30 NOTE — Progress Notes (Signed)
On recall  °

## 2016-11-30 NOTE — Patient Instructions (Signed)
COMPLETE YOUR LABS.  DRINK WATER TO KEEP YOUR URINE LIGHT YELLOW.  CONTINUE YOUR WEIGHT LOSS EFFORTS.  CHEW ONE TUMS WITH MEALS UP TO THREE TIMES A DAY TO PREVENT DIARRHEA AFTER EATING.  FOLLOW A HIGH FIBER/LOW FAT DIET. AVOID ITEMS THAT CAUSE BLOATING & GAS.   FOLLOW UP IN 6-12 MOS.   PLEASE LET ME KNOW WHEN YOU WOULD LIKE TO HAVE YOUR COLONOSCOPY.THE BENEFITS ARE IT DETECTS POLYPS AND REDUCE RISKS FOR COLON CANCER.  THE RISK OF COLONOSCOPY ARE : < 1% chance of medication reaction, bleeding, perforation, or rupture of spleen/liver.

## 2016-11-30 NOTE — Assessment & Plan Note (Addendum)
AVERAGE RISK.  DECLINES TCS AT THIS TIME. PT WILL LET ME KNOW WHEN SHE WOULD LIKE TO HAVE A COLONOSCOPY. DISCUSSED THE BENEFITS & RISKS FOR COLON  OF COLONOSCOPY ARE : < 1% chance of medication reaction, bleeding, perforation, or rupture of spleen/liver.   PT WILL NEED SUPREP OR MOVIPREP AND MAC-FAILED CONSCIOUS SEDATION IN THE PAST.

## 2016-11-30 NOTE — Progress Notes (Signed)
cc'ed to pcp °

## 2016-11-30 NOTE — Assessment & Plan Note (Signed)
S/P CHOLECYSTECTOMY AND SYMPTOMS CONTROLLED/RESOLVED.  NO ADDITIONAL WORKUP NEEDED AT THIS TIME.

## 2016-11-30 NOTE — Progress Notes (Signed)
Subjective:    Patient ID: Rachel Branch, female    DOB: [birth year], 61 y.o.   MRN: [id number]  Alonza Bogus, MD  HPI Feeling better since GB came out. THANKFUL TO BE PAIN FREE. HAD TACOS AFTER CHURCH AND HAD DIARRHEA/ABDOMINAL PAIN. BMS: #4, BUT COUPLE TIMES MORE LOOSE IF EATS GREASE OR FRUIT. LOST 5 LBS OVER PAST MO. LAST COLONOSCOPY YEARS AGO.  PT DENIES FEVER, CHILLS, HEMATOCHEZIA, HEMATEMESIS, nausea, vomiting, melena, CHEST PAIN, SHORTNESS OF BREATH, CHANGE IN BOWEL IN HABITS, constipation, problems swallowing, OR heartburn or indigestion.  Past Medical History:  Diagnosis Date  . GERD (gastroesophageal reflux disease)   . Hypothyroidism   . IBS (irritable bowel syndrome)   . Pernicious anemia    B12 injections every other week  . Simple endometrial hyperplasia without atypia 08/2013   On hysteroscopic polyp resection pathology   Past Surgical History:  Procedure Laterality Date  . ABDOMINAL HYSTERECTOMY    . CHOLECYSTECTOMY N/A 10/24/2016   Procedure: LAPAROSCOPIC CHOLECYSTECTOMY;  Surgeon: Aviva Signs, MD;  Location: [facility];  Service: General;  Laterality: N/A;  . COMBINED HYSTEROSCOPY DIAGNOSTIC / D&C  03/14/11   benign endometrial polyps  . CYSTOSCOPY N/A 07/28/2015   Procedure: CYSTOSCOPY;  Surgeon: Anastasio Auerbach, MD;  Location: [facility];  Service: Gynecology;  Laterality: N/A;  . DILATATION & CURETTAGE/HYSTEROSCOPY WITH MYOSURE N/A 12/02/2014   Procedure: DILATATION & CURETTAGE/HYSTEROSCOPY WITH MYOSURE;  Surgeon: Anastasio Auerbach, MD;  Location: [facility];  Service: Gynecology;  Laterality: N/A;  . DILATATION & CURETTAGE/HYSTEROSCOPY WITH TRUECLEAR N/A 09/18/2013   Procedure: DILATATION & CURETTAGE/HYSTEROSCOPY WITH TRUCLEAR;  Surgeon: Anastasio Auerbach, MD;  Location: [facility];  Service: Gynecology;  Laterality: N/A;  . DILATION AND CURETTAGE OF UTERUS  2007  . LAPAROSCOPIC ASSISTED VAGINAL HYSTERECTOMY N/A 07/28/2015   Procedure: LAPAROSCOPIC ASSISTED VAGINAL  HYSTERECTOMY;  Surgeon: Anastasio Auerbach, MD;  Location: [facility];  Service: Gynecology;  Laterality: N/A;  . LAPAROSCOPIC BILATERAL SALPINGO OOPHERECTOMY Bilateral 07/28/2015   Procedure: LAPAROSCOPIC BILATERAL SALPINGO OOPHORECTOMY;  Surgeon: Anastasio Auerbach, MD;  Location: [facility];  Service: Gynecology;  Laterality: Bilateral;  . LAPAROSCOPIC LYSIS OF ADHESIONS N/A 07/28/2015   Procedure: LAPAROSCOPIC LYSIS OF ADHESIONS;  Surgeon: Anastasio Auerbach, MD;  Location: [facility];  Service: Gynecology;  Laterality: N/A;  . LIVER BIOPSY N/A 10/24/2016   Procedure: LIVER BIOPSY;  Surgeon: Aviva Signs, MD;  Location: [facility];  Service: General;  Laterality: N/A;  . TUBAL LIGATION  1997    Allergies  Allergen Reactions  . Latex Rash    Mouth ulcers after dental work with Latex gloves.  . Betadine [Povidone Iodine] Itching and Rash    Current Outpatient Prescriptions  Medication Sig Dispense Refill  . cetirizine (ZYRTEC) 10 MG tablet Take 10 mg by mouth daily.    Marland Kitchen VITAMIN D 2000 units tablet Take 2,000 Units by mouth daily.     . Cyanocobalamin 1000 MCG/ML KIT Inject 1 mL as directed every 14 (fourteen) days.    Marland Kitchen levothyroxine (SYNTHROID, LEVOTHROID) 112 MCG tablet TAKE 1 TABLET(112 MCG) BY MOUTH DAILY BEFORE BREAKFAST    . omeprazole (PRILOSEC) 20 MG capsule Take 1 capsule (20 mg total) by mouth daily before breakfast.    . Simethicone (GAS-X PO) Take 1 capsule by mouth daily as needed (gas).     . simethicone  125 MG chewable tablet Chew 125-250 mg by mouth every 6 (six) hours as needed for flatulence.    . loperamide (IMODIUM)  2 MG capsule Take by mouth as needed for diarrhea or loose stools.    Marland Kitchen omeprazole-sodium bicarbonate (ZEGERID) 40-1100 MG capsule Take 1 capsule by mouth daily before breakfast. (Patient not taking: Reported on 11/30/2016)    . ondansetron (ZOFRAN ODT) 4 MG disintegrating tablet Take 1 tablet (4 mg total) by mouth every 8 (eight) hours as needed for nausea or vomiting.  (Patient not taking: Reported on 11/30/2016)    . traMADol (ULTRAM) 50 MG tablet Take 1 tablet (50 mg total) by mouth every 6 (six) hours as needed. (Patient not taking: Reported on 11/30/2016)     Review of Systems PER HPI OTHERWISE ALL SYSTEMS ARE NEGATIVE.    Objective:   Physical Exam  Constitutional: She is oriented to person, place, and time. She appears well-developed and well-nourished. No distress.  HENT:  Head: Normocephalic and atraumatic.  Mouth/Throat: Oropharynx is clear and moist. No oropharyngeal exudate.  Eyes: Pupils are equal, round, and reactive to light. No scleral icterus.  Neck: Normal range of motion. Neck supple.  Cardiovascular: Normal rate, regular rhythm and normal heart sounds.   Pulmonary/Chest: Effort normal and breath sounds normal. No respiratory distress.  Abdominal: Soft. Bowel sounds are normal. She exhibits no distension. There is no tenderness.  Musculoskeletal: She exhibits no edema.  Lymphadenopathy:    She has no cervical adenopathy.  Neurological: She is alert and oriented to person, place, and time.  Psychiatric: She has a normal mood and affect.  Vitals reviewed.     Assessment & Plan:

## 2016-12-02 LAB — HEPATIC FUNCTION PANEL
ALT: 137 U/L — ABNORMAL HIGH (ref 6–29)
AST: 99 U/L — ABNORMAL HIGH (ref 10–35)
Albumin: 3.5 g/dL — ABNORMAL LOW (ref 3.6–5.1)
Alkaline Phosphatase: 132 U/L — ABNORMAL HIGH (ref 33–130)
Bilirubin, Direct: 0.1 mg/dL
Indirect Bilirubin: 0.4 mg/dL (ref 0.2–1.2)
Total Bilirubin: 0.5 mg/dL (ref 0.2–1.2)
Total Protein: 6.3 g/dL (ref 6.1–8.1)

## 2016-12-06 ENCOUNTER — Ambulatory Visit (INDEPENDENT_AMBULATORY_CARE_PROVIDER_SITE_OTHER): Payer: 59 | Admitting: Gynecology

## 2016-12-06 ENCOUNTER — Encounter: Payer: Self-pay | Admitting: Gynecology

## 2016-12-06 VITALS — BP 138/86 | Ht 65.0 in | Wt 237.0 lb

## 2016-12-06 DIAGNOSIS — Z01411 Encounter for gynecological examination (general) (routine) with abnormal findings: Secondary | ICD-10-CM | POA: Diagnosis not present

## 2016-12-06 DIAGNOSIS — N952 Postmenopausal atrophic vaginitis: Secondary | ICD-10-CM

## 2016-12-06 NOTE — Progress Notes (Signed)
    Rachel Branch 07-Feb-1956 [id number]        61 y.o.  [id number] for annual exam.   Past medical history,surgical history, problem list, medications, allergies, family history and social history were all reviewed and documented as reviewed in the EPIC chart.  ROS:  Performed with pertinent positives and negatives included in the history, assessment and plan.   Additional significant findings :  None   Exam: Copywriter, advertising Vitals:   12/06/16 1404  BP: 138/86  Weight: 237 lb (107.5 kg)  Height: 5\' 5"  (1.651 m)   Body mass index is 39.44 kg/m.  General appearance:  Normal affect, orientation and appearance. Skin: Grossly normal HEENT: Without gross lesions.  No cervical or supraclavicular adenopathy. Thyroid normal.  Lungs:  Clear without wheezing, rales or rhonchi Cardiac: RR, without RMG Abdominal:  Soft, nontender, without masses, guarding, rebound, organomegaly or hernia Breasts:  Examined lying and sitting without masses, retractions, discharge or axillary adenopathy. Pelvic:  Ext, BUS, Vagina: With atrophic changes  Adnexa: Without masses or tenderness    Anus and perineum: Normal   Rectovaginal: Normal sphincter tone without palpated masses or tenderness.    Assessment/Plan:  61 y.o. [id number] female for annual exam.   1. Status post LAVH BSO 07/2015 for irregular bleeding and pelvic adhesions. Doing well without significant hot flushes, night sweats or vaginal dryness. Continue to monitor report any issues. 2. Pap smear 08/2013. Pap smear of vaginal cuff done today. Reviewed current screening guidelines and options to stop screening altogether based on hysterectomy history discussed. Will readdress on annual basis. 3. Mammography coming due in April and she knows to call and schedule this. SBE monthly reviewed. 4. Colonoscopy scheduled in November. Is being followed for elevated LFTs. Had cholecystectomy done this past year. 5. DEXA 2013 normal. Will plan repeat DEXA  further into the menopause. 6. Health maintenance. Blood pressure 138/86. Continue to follow up with her primary who she sees regularly. No routine lab work done as patient reports is done elsewhere. Follow up in one year, sooner as needed.   [name], 2:25 PM 12/06/2016

## 2016-12-06 NOTE — Patient Instructions (Signed)

## 2016-12-06 NOTE — Addendum Note (Signed)
Addended by: Thurnell Garbe A on: 12/06/2016 02:36 PM   Modules accepted: Orders

## 2016-12-08 LAB — PAP IG W/ RFLX HPV ASCU

## 2016-12-27 ENCOUNTER — Other Ambulatory Visit: Payer: Self-pay | Admitting: Gynecology

## 2017-01-04 ENCOUNTER — Other Ambulatory Visit: Payer: Self-pay | Admitting: Gastroenterology

## 2017-01-04 ENCOUNTER — Other Ambulatory Visit: Payer: Self-pay | Admitting: Gynecology

## 2017-01-04 DIAGNOSIS — E038 Other specified hypothyroidism: Secondary | ICD-10-CM

## 2017-01-04 NOTE — Telephone Encounter (Signed)
I received a request to refill her Synthroid. I thought her other doctor was doing this. It is okay for [place] to do this through next year but check with the patient make sure she is having a TSH checked either through her primary or she can come by here just to make sure she is in the therapeutic range.

## 2017-01-05 NOTE — Telephone Encounter (Signed)
Left message to call with girl who answered.

## 2017-01-05 NOTE — Telephone Encounter (Signed)
Patient called. Informed. Rx sent and lab order placed. She said she will call for lab appt and come by next week. She said PCP did not check TSH.

## 2017-01-12 ENCOUNTER — Other Ambulatory Visit: Payer: 59

## 2017-01-12 DIAGNOSIS — E038 Other specified hypothyroidism: Secondary | ICD-10-CM

## 2017-01-12 LAB — TSH: TSH: 0.85 mIU/L

## 2017-01-19 DIAGNOSIS — L309 Dermatitis, unspecified: Secondary | ICD-10-CM | POA: Diagnosis not present

## 2017-01-31 DIAGNOSIS — D519 Vitamin B12 deficiency anemia, unspecified: Secondary | ICD-10-CM | POA: Diagnosis not present

## 2017-01-31 DIAGNOSIS — K219 Gastro-esophageal reflux disease without esophagitis: Secondary | ICD-10-CM | POA: Diagnosis not present

## 2017-01-31 DIAGNOSIS — E039 Hypothyroidism, unspecified: Secondary | ICD-10-CM | POA: Diagnosis not present

## 2017-02-28 DIAGNOSIS — D519 Vitamin B12 deficiency anemia, unspecified: Secondary | ICD-10-CM | POA: Diagnosis not present

## 2017-02-28 DIAGNOSIS — E039 Hypothyroidism, unspecified: Secondary | ICD-10-CM | POA: Diagnosis not present

## 2017-02-28 DIAGNOSIS — Z0000 Encounter for general adult medical examination without abnormal findings: Secondary | ICD-10-CM | POA: Diagnosis not present

## 2017-03-06 DIAGNOSIS — E039 Hypothyroidism, unspecified: Secondary | ICD-10-CM | POA: Diagnosis not present

## 2017-03-06 DIAGNOSIS — Z0000 Encounter for general adult medical examination without abnormal findings: Secondary | ICD-10-CM | POA: Diagnosis not present

## 2017-03-06 DIAGNOSIS — D519 Vitamin B12 deficiency anemia, unspecified: Secondary | ICD-10-CM | POA: Diagnosis not present

## 2017-03-06 DIAGNOSIS — M25552 Pain in left hip: Secondary | ICD-10-CM | POA: Diagnosis not present

## 2017-04-26 ENCOUNTER — Encounter: Payer: Self-pay | Admitting: Gastroenterology

## 2017-06-07 DIAGNOSIS — L309 Dermatitis, unspecified: Secondary | ICD-10-CM | POA: Diagnosis not present

## 2017-06-07 DIAGNOSIS — L308 Other specified dermatitis: Secondary | ICD-10-CM | POA: Diagnosis not present

## 2017-06-21 ENCOUNTER — Telehealth: Payer: Self-pay

## 2017-06-21 ENCOUNTER — Other Ambulatory Visit: Payer: Self-pay

## 2017-06-21 ENCOUNTER — Ambulatory Visit (INDEPENDENT_AMBULATORY_CARE_PROVIDER_SITE_OTHER): Payer: 59 | Admitting: Gastroenterology

## 2017-06-21 ENCOUNTER — Encounter: Payer: Self-pay | Admitting: Gastroenterology

## 2017-06-21 DIAGNOSIS — K219 Gastro-esophageal reflux disease without esophagitis: Secondary | ICD-10-CM

## 2017-06-21 DIAGNOSIS — K7581 Nonalcoholic steatohepatitis (NASH): Secondary | ICD-10-CM | POA: Diagnosis not present

## 2017-06-21 DIAGNOSIS — Z1211 Encounter for screening for malignant neoplasm of colon: Secondary | ICD-10-CM

## 2017-06-21 NOTE — Progress Notes (Signed)
ON RECALL  °

## 2017-06-21 NOTE — Progress Notes (Signed)
cc'ed to pcp °

## 2017-06-21 NOTE — Telephone Encounter (Signed)
Called and informed pt of pre-op appt 07/13/17 at 11:00am. Letter also mailed.

## 2017-06-21 NOTE — Patient Instructions (Addendum)
Complete colonoscopy with propofol in late OCT OR EARLY NOVEMBER. FULL LIQUI DIET N THE DAY BEFORE. USE CLEANPIQ OR MIRALAX PREP FOR BOWEL CLEANSING. IF CLEANPIQ CAUSES NAUSEA OR VOMITING, CHANGE TO A MIRALAX PREP. TAKE MIRALAX 17 GMS PO Q1H FROM 12N TO 6PM. DRINK 1 CUP OF LIQUID 30 MINUTES AFTER EACH DOSE: 1230P TO 630PM. TAKE DULCOLAX 10 MG PO AT 2 PM AND 6 PM.     CONTINUE YOUR WEIGHT LOSS EFFORTS.  WHILE I DO NOT WANT TO ALARM YOU, YOUR BODY MASS INDEX IS OVER 40 WHICH MEANS YOU ARE MORBIDLY BESE. THIS CAN SHORTEN YOUR LIFE EXPECTANCY 10 YEARS. AS WELL OBESITY ACTIVATES CANCER GENES.  OBESITY IS ASSOCIATED WITH AN INCREASED RISK FOR CIRRHOSIS AND ALL CANCERS, INCLUDING ESOPHAGEAL AND COLON CANCER.  CONSIDER A PLANT BASED DIET-NO MEAT OR DAIRY for 6 MOS. AVOID ITEMS THAT CAUSE BLOATING & GAS. I RECOMMEND THE BOOK, "PREVENT AND REVERSE HEART DISEASE", CALDWELL ESSELSTYN JR., MD. PAGES 120-121 [place] THE DIET AND THE LAST HALF OF THE BOOK HAS QUICK AND EASY RECIPES FOR BREAKFAST, LUNCH, AND DINNER ARE AFTER P 127.    CONTINUE ZEGERID IF NEEDED TO CONTROL HEARTBURN.    FOLLOW UP IN 6 MOS.    Full Liquid Diet A high-calorie, high-protein supplement should be used to meet your nutritional requirements when the full liquid diet is continued for more than 2 or 3 days. If this diet is to be used for an extended period of time (more than 7 days), a multivitamin should be considered.  Breads and Starches  Allowed: None are allowed   Avoid: Any others.    Potatoes/Pasta/Rice  Allowed: ANY ITEM AS A SOUP OR SMALL PLATE OF MASHED POTATOES OR SCRAMBLED EGGS. (DO NOT EAT MORE THAN ONE SERVING ON THE DAY BEFORE COLONOSCOPY).      Vegetables  Allowed: Strained tomato or vegetable juice. Vegetables pureed in soup.   Avoid: Any others.    Fruit  Allowed: Any strained fruit juices and fruit drinks. Include 1 serving of citrus or vitamin C-enriched fruit juice daily.    Avoid: Any others.  Meat and Meat Substitutes  Allowed: Egg  Avoid: Any meat, fish, or fowl. All cheese.  Milk  Allowed: SOY Milk beverages, including milk shakes and instant breakfast mixes. Smooth yogurt.   Avoid: Any others. Avoid dairy products if not tolerated.    Soups and Combination Foods  Allowed: Broth, strained cream soups. Strained, broth-based soups.   Avoid: Any others.    Desserts and Sweets  Allowed: flavored gelatin, tapioca, ice cream, sherbet, smooth pudding, junket, fruit ices, frozen ice pops, pudding pops, frozen fudge pops, chocolate syrup. Sugar, honey, jelly, syrup.   Avoid: Any others.  Fats and Oils  Allowed: Margarine, butter, cream, sour cream, oils.   Avoid: Any others.  Beverages  Allowed: All.   Avoid: None.  Condiments  Allowed: Iodized salt, pepper, spices, flavorings. Cocoa powder.   Avoid: Any others.    SAMPLE MEAL PLAN Breakfast   cup orange juice.   1 OR 2 EGGS  1 cup milk.   1 cup beverage (coffee or tea).   Cream or sugar, if desired.    Midmorning Snack  2 SCRAMBLED OR HARD BOILED EGG   Lunch  1 cup cream soup.    cup fruit juice.   1 cup milk.    cup custard.   1 cup beverage (coffee or tea).   Cream or sugar, if desired.  Midafternoon Snack  1 cup milk shake.  Dinner  1 cup cream soup.    cup fruit juice.   1 cup MILK    cup pudding.   1 cup beverage (coffee or tea).   Cream or sugar, if desired.  Evening Snack  1 cup supplement.  To increase calories, add sugar, cream, butter, or margarine if possible. Nutritional supplements will also increase the total calories.

## 2017-06-21 NOTE — Assessment & Plan Note (Signed)
AVERAGE RISK. NO BRBPR OR MELENA.  Complete colonoscopy with propofol in late OCT OR EARLY NOVEMBER. FULL LIQUI DIET N THE DAY BEFORE. USE CLEANPIQ OR MIRALAX PREP FOR BOWEL CLEANSING. IF CLEANPIQ CAUSES NAUSEA OR VOMITING, CHANGE TO A MIRALAX PREP. TAKE MIRALAX 17 GMS PO Q1H FROM 12N TO 6PM. DRINK 1 CUP OF LIQUID 30 MINUTES AFTER EACH DOSE: 1230P TO 630PM. TAKE DULCOLAX 10 MG PO AT 2 PM AND 6 PM. DISCUSSED PROCEDURE, BENEFITS, & RISKS: < 1% chance of medication reaction, bleeding, perforation, or rupture of spleen/liver.  FOLLOW UP IN 6 MOS.

## 2017-06-21 NOTE — Assessment & Plan Note (Signed)
SYMPTOMS FAIRLY WELL CONTROLLED.  CONTINUE TO MONITOR SYMPTOMS. ZEGERID IF NEEDED

## 2017-06-21 NOTE — Patient Instructions (Signed)
PA info for TCS submitted via [facility] website. Case approved. PA# [id number].

## 2017-06-21 NOTE — Assessment & Plan Note (Signed)
ASSOCIATED WITH ELEVATED LIVER ENZYMES AND LIVER BIOPSY SHOWING MILD INFLAMMATION/STEATOSIS.    CONTINUE YOUR WEIGHT LOSS EFFORTS.  WHILE I DO NOT WANT TO ALARM YOU, YOUR BODY MASS INDEX IS OVER 40 WHICH MEANS YOU ARE MORBIDLY BESE. THIS CAN SHORTEN YOUR LIFE EXPECTANCY 10 YEARS. AS WELL OBESITY ACTIVATES CANCER GENES.  OBESITY IS ASSOCIATED WITH AN INCREASED RISK FOR CIRRHOSIS AND ALL CANCERS, INCLUDING ESOPHAGEAL AND COLON CANCER.  CONSIDER A PLANT BASED DIET-NO MEAT OR DAIRY for 6 MOS. AVOID ITEMS THAT CAUSE BLOATING & GAS. I RECOMMEND THE BOOK, "PREVENT AND REVERSE HEART DISEASE", CALDWELL ESSELSTYN JR., MD. PAGES 120-121 [place] THE DIET AND THE LAST HALF OF THE BOOK HAS QUICK AND EASY RECIPES FOR BREAKFAST, LUNCH, AND DINNER ARE AFTER P 127. DISCUSSED BENEFITS, AND MANAGEMENT OF FATTY LIVER DISEASE INCLUDING CIRRHOSIS.  FOLLOW UP IN 6 MOS.

## 2017-06-21 NOTE — Assessment & Plan Note (Signed)
WEIGHT STABLE. BMI > 40.   CONTINUE YOUR WEIGHT LOSS EFFORTS.  WHILE I DO NOT WANT TO ALARM YOU, YOUR BODY MASS INDEX IS OVER 40 WHICH MEANS YOU ARE MORBIDLY BESE. THIS CAN SHORTEN YOUR LIFE EXPECTANCY 10 YEARS. AS WELL OBESITY ACTIVATES CANCER GENES.  OBESITY IS ASSOCIATED WITH AN INCREASED RISK FOR CIRRHOSIS AND ALL CANCERS, INCLUDING ESOPHAGEAL AND COLON CANCER.  CONSIDER A PLANT BASED DIET-NO MEAT OR DAIRY for 6 MOS. AVOID ITEMS THAT CAUSE BLOATING & GAS. I RECOMMEND THE BOOK, "PREVENT AND REVERSE HEART DISEASE", CALDWELL ESSELSTYN JR., MD. PAGES 120-121 [place] THE DIET AND THE LAST HALF OF THE BOOK HAS QUICK AND EASY RECIPES FOR BREAKFAST, LUNCH, AND DINNER ARE AFTER P 127. DISCUSSED BENEFITS, AND MANAGEMENT OF FATTY LIVER DISEASE INCLUDING CIRRHOSIS.  FOLLOW UP IN 6 MOS.

## 2017-06-21 NOTE — Progress Notes (Signed)
Subjective:    Patient ID: Rachel Branch, female    DOB: 06-11-1956, 61 y.o.   MRN: [id number]  PCP: Celene Squibb.   HPI RARE HEARTBURN OR INDIGESTION, BUT HAD GB TAKEN OUT. OFF OMEPRAZOLE. CHANGED FROM GREEN TO PINK FOR ONE SHE HAD A RASH BUT TOLERATES OMEPRAZOLE. TAKES ONE ZEGERID IF NEEDED. TAKES GAS-X ABOUT EVERY DAY. BMs: ABOUT EVERY OTHER DAY, NL PATTERN. NEW GB BOY: AUG 4, JONAH.  PT DENIES FEVER, CHILLS, HEMATOCHEZIA, nausea, vomiting, melena, diarrhea, CHEST PAIN, SHORTNESS OF BREATH, CHANGE IN BOWEL IN HABITS, constipation, abdominal pain, problems swallowing, OR problems with sedation.  Past Medical History:  Diagnosis Date  . GERD (gastroesophageal reflux disease)   . Hypothyroidism   . IBS (irritable bowel syndrome)   . Pernicious anemia    B12 injections every other week   Past Surgical History:  Procedure Laterality Date  . ABDOMINAL HYSTERECTOMY    . CHOLECYSTECTOMY N/A 10/24/2016     . COMBINED HYSTEROSCOPY DIAGNOSTIC / D&C  03/14/11   benign endometrial polyps  . CYSTOSCOPY N/A 07/28/2015   Procedure: CYSTOSCOPY;  Surgeon: Anastasio Auerbach, MD;  Location: [facility];  Service: Gynecology;  Laterality: N/A;  . DILATATION & CURETTAGE/HYSTEROSCOPY WITH MYOSURE N/A 12/02/2014   Procedure: DILATATION & CURETTAGE/HYSTEROSCOPY WITH MYOSURE;  Surgeon: Anastasio Auerbach, MD;  Location: [facility];  Service: Gynecology;  Laterality: N/A;  . DILATATION & CURETTAGE/HYSTEROSCOPY WITH TRUECLEAR N/A 09/18/2013   Procedure: DILATATION & CURETTAGE/HYSTEROSCOPY WITH TRUCLEAR;  Surgeon: Anastasio Auerbach, MD;  Location: [facility];  Service: Gynecology;  Laterality: N/A;  . DILATION AND CURETTAGE OF UTERUS  2007  . LAPAROSCOPIC ASSISTED VAGINAL HYSTERECTOMY N/A 07/28/2015   Procedure: LAPAROSCOPIC ASSISTED VAGINAL HYSTERECTOMY;  Surgeon: Anastasio Auerbach, MD;  Location: [facility];  Service: Gynecology;  Laterality: N/A;  . LAPAROSCOPIC BILATERAL SALPINGO OOPHERECTOMY Bilateral 07/28/2015   Procedure: LAPAROSCOPIC BILATERAL SALPINGO OOPHORECTOMY;  Surgeon: Anastasio Auerbach, MD;  Location: [facility];  Service: Gynecology;  Laterality: Bilateral;  . LAPAROSCOPIC LYSIS OF ADHESIONS N/A 07/28/2015   Procedure: LAPAROSCOPIC LYSIS OF ADHESIONS;  Surgeon: Anastasio Auerbach, MD;  Location: [facility];  Service: Gynecology;  Laterality: N/A;  . LIVER BIOPSY N/A 10/24/2016   Procedure: LIVER BIOPSY;  Surgeon: Aviva Signs, MD;  Location: [facility];  Service: General;  Laterality: N/A;  . TUBAL LIGATION  1997    Allergies  Allergen Reactions  . Latex Rash    Mouth ulcers after dental work with Latex gloves.  . Omeprazole     RASH  . Betadine [Povidone Iodine] Itching and Rash    Current Outpatient Prescriptions  Medication Sig Dispense Refill  . cetirizine (ZYRTEC) 10 MG tablet Take 10 mg by mouth daily.    . Cholecalciferol (VITAMIN D) 2000 units tablet Take 2,000 Units by mouth daily.     . Cyanocobalamin 1000 MCG/ML KIT Inject 1 mL as directed every 14 (fourteen) days.    Marland Kitchen levothyroxine (SYNTHROID, LEVOTHROID) 112 MCG tablet TAKE 1 TABLET(112 MCG) BY MOUTH DAILY BEFORE BREAKFAST    . loperamide (IMODIUM) 2 MG capsule Take by mouth as needed for diarrhea or loose stools.    Marland Kitchen omeprazole-sodium bicarbonate (ZEGERID) 40-1100 MG capsule Take 1 capsule by mouth as needed.)    . Simethicone (GAS-X PO) Take 1 capsule by mouth daily as needed (gas).     .      .      .      Marland Kitchen  Review of Systems PER HPI OTHERWISE ALL SYSTEMS ARE NEGATIVE.    Objective:   Physical Exam  Constitutional: She is oriented to person, place, and time. She appears well-developed and well-nourished. No distress.  HENT:  Head: Normocephalic and atraumatic.  Mouth/Throat: Oropharynx is clear and moist. No oropharyngeal exudate.  Eyes: Pupils are equal, round, and reactive to light. No scleral icterus.  Neck: Normal range of motion. Neck supple.  Cardiovascular: Normal rate, regular rhythm and normal heart  sounds.   Pulmonary/Chest: Effort normal and breath sounds normal. No respiratory distress.  Abdominal: Soft. Bowel sounds are normal. She exhibits no distension. There is no tenderness.  Musculoskeletal: She exhibits no edema.  Lymphadenopathy:    She has no cervical adenopathy.  Neurological: She is alert and oriented to person, place, and time.  NO  NEW FOCAL DEFICITS  Psychiatric:  FLAT AFFECT, ANXIOUS MOOD   Vitals reviewed.      Assessment & Plan:

## 2017-07-11 NOTE — Patient Instructions (Signed)
Rachel Branch  07/11/2017     @PREFPERIOPPHARMACY @   Your procedure is scheduled on  07/18/2017   Report to [facility] at  645   A.M.  Call this number if you have problems the morning of surgery:  [phone]   Remember:  Do not eat food or drink liquids after midnight.  Take these medicines the morning of surgery with A SIP OF WATER  Zyrtec, levothyroxine, omeprazole.   Do not wear jewelry, make-up or nail polish.  Do not wear lotions, powders, or perfumes, or deoderant.  Do not shave 48 hours prior to surgery.  Men may shave face and neck.  Do not bring valuables to the hospital.  [facility] is not responsible for any belongings or valuables.  Contacts, dentures or bridgework may not be worn into surgery.  Leave your suitcase in the car.  After surgery it may be brought to your room.  For patients admitted to the hospital, discharge time will be determined by your treatment team.  Patients discharged the day of surgery will not be allowed to drive home.   Name and phone number of your driver:   Family    Special instructions:  Follow the diet and prep instructions given to you by Dr Nona Dell office.  Please read over the following fact sheets that you were given. Anesthesia Post-op Instructions and Care and Recovery After Surgery       Colonoscopy, Adult A colonoscopy is an exam to look at the entire large intestine. During the exam, a lubricated, bendable tube is inserted into the anus and then passed into the rectum, colon, and other parts of the large intestine. A colonoscopy is often done as a part of normal colorectal screening or in response to certain symptoms, such as anemia, persistent diarrhea, abdominal pain, and blood in the stool. The exam can help screen for and diagnose medical problems, including:  Tumors.  Polyps.  Inflammation.  Areas of bleeding.  Tell a health care provider about:  Any allergies you have.  All medicines  you are taking, including vitamins, herbs, eye drops, creams, and over-the-counter medicines.  Any problems you or family members have had with anesthetic medicines.  Any blood disorders you have.  Any surgeries you have had.  Any medical conditions you have.  Any problems you have had passing stool. What are the risks? Generally, this is a safe procedure. However, problems may occur, including:  Bleeding.  A tear in the intestine.  A reaction to medicines given during the exam.  Infection (rare).  What happens before the procedure? Eating and drinking restrictions Follow instructions from your health care provider about eating and drinking, which may include:  A few days before the procedure - follow a low-fiber diet. Avoid nuts, seeds, dried fruit, raw fruits, and vegetables.  1-3 days before the procedure - follow a clear liquid diet. Drink only clear liquids, such as clear broth or bouillon, black coffee or tea, clear juice, clear soft drinks or sports drinks, gelatin dessert, and popsicles. Avoid any liquids that contain red or purple dye.  On the day of the procedure - do not eat or drink anything during the 2 hours before the procedure, or within the time period that your health care provider recommends.  Bowel prep If you were prescribed an oral bowel prep to clean out your colon:  Take it as told by your health care provider. Starting the  day before your procedure, you will need to drink a large amount of medicated liquid. The liquid will cause you to have multiple loose stools until your stool is almost clear or light green.  If your skin or anus gets irritated from diarrhea, you may use these to relieve the irritation: ? Medicated wipes, such as adult wet wipes with aloe and vitamin E. ? A skin soothing-product like petroleum jelly.  If you vomit while drinking the bowel prep, take a break for up to 60 minutes and then begin the bowel prep again. If vomiting  continues and you cannot take the bowel prep without vomiting, call your health care provider.  General instructions  Ask your health care provider about changing or stopping your regular medicines. This is especially important if you are taking diabetes medicines or blood thinners.  Plan to have someone take you home from the hospital or clinic. What happens during the procedure?  An IV tube may be inserted into one of your veins.  You will be given medicine to help you relax (sedative).  To reduce your risk of infection: ? Your health care team will wash or sanitize their hands. ? Your anal area will be washed with soap.  You will be asked to lie on your side with your knees bent.  Your health care provider will lubricate a long, thin, flexible tube. The tube will have a camera and a light on the end.  The tube will be inserted into your anus.  The tube will be gently eased through your rectum and colon.  Air will be delivered into your colon to keep it open. You may feel some pressure or cramping.  The camera will be used to take images during the procedure.  A small tissue sample may be removed from your body to be examined under a microscope (biopsy). If any potential problems are found, the tissue will be sent to a lab for testing.  If small polyps are found, your health care provider may remove them and have them checked for cancer cells.  The tube that was inserted into your anus will be slowly removed. The procedure may vary among health care providers and hospitals. What happens after the procedure?  Your blood pressure, heart rate, breathing rate, and blood oxygen level will be monitored until the medicines you were given have worn off.  Do not drive for 24 hours after the exam.  You may have a small amount of blood in your stool.  You may pass gas and have mild abdominal cramping or bloating due to the air that was used to inflate your colon during the  exam.  It is up to you to get the results of your procedure. Ask your health care provider, or the department performing the procedure, when your results will be ready. This information is not intended to replace advice given to you by your health care provider. Make sure you discuss any questions you have with your health care provider. Document Released: 09/02/2000 Document Revised: 07/06/2016 Document Reviewed: 11/17/2015 Elsevier Interactive Patient Education  2018 [employer].  Colonoscopy, Adult, Care After This sheet gives you information about how to care for yourself after your procedure. Your health care provider may also give you more specific instructions. If you have problems or questions, contact your health care provider. What can I expect after the procedure? After the procedure, it is common to have:  A small amount of blood in your stool for 24 hours  after the procedure.  Some gas.  Mild abdominal cramping or bloating.  Follow these instructions at home: General instructions   For the first 24 hours after the procedure: ? Do not drive or use machinery. ? Do not sign important documents. ? Do not drink alcohol. ? Do your regular daily activities at a slower pace than normal. ? Eat soft, easy-to-digest foods. ? Rest often.  Take over-the-counter or prescription medicines only as told by your health care provider.  It is up to you to get the results of your procedure. Ask your health care provider, or the department performing the procedure, when your results will be ready. Relieving cramping and bloating  Try walking around when you have cramps or feel bloated.  Apply heat to your abdomen as told by your health care provider. Use a heat source that your health care provider recommends, such as a moist heat pack or a heating pad. ? Place a towel between your skin and the heat source. ? Leave the heat on for 20-30 minutes. ? Remove the heat if your skin turns  bright red. This is especially important if you are unable to feel pain, heat, or cold. You may have a greater risk of getting burned. Eating and drinking  Drink enough fluid to keep your urine clear or pale yellow.  Resume your normal diet as instructed by your health care provider. Avoid heavy or fried foods that are hard to digest.  Avoid drinking alcohol for as long as instructed by your health care provider. Contact a health care provider if:  You have blood in your stool 2-3 days after the procedure. Get help right away if:  You have more than a small spotting of blood in your stool.  You pass large blood clots in your stool.  Your abdomen is swollen.  You have nausea or vomiting.  You have a fever.  You have increasing abdominal pain that is not relieved with medicine. This information is not intended to replace advice given to you by your health care provider. Make sure you discuss any questions you have with your health care provider. Document Released: 04/19/2004 Document Revised: 05/30/2016 Document Reviewed: 11/17/2015 Elsevier Interactive Patient Education  2018 [place] Anesthesia is a term that refers to techniques, procedures, and medicines that help a person stay safe and comfortable during a medical procedure. Monitored anesthesia care, or sedation, is one type of anesthesia. Your anesthesia specialist may recommend sedation if you will be having a procedure that does not require you to be unconscious, such as:  Cataract surgery.  A dental procedure.  A biopsy.  A colonoscopy.  During the procedure, you may receive a medicine to help you relax (sedative). There are three levels of sedation:  Mild sedation. At this level, you may feel awake and relaxed. You will be able to follow directions.  Moderate sedation. At this level, you will be sleepy. You may not remember the procedure.  Deep sedation. At this level, you will be  asleep. You will not remember the procedure.  The more medicine you are given, the deeper your level of sedation will be. Depending on how you respond to the procedure, the anesthesia specialist may change your level of sedation or the type of anesthesia to fit your needs. An anesthesia specialist will monitor you closely during the procedure. Let your health care provider know about:  Any allergies you have.  All medicines you are taking, including vitamins, herbs,  eye drops, creams, and over-the-counter medicines.  Any use of steroids (by mouth or as a cream).  Any problems you or family members have had with sedatives and anesthetic medicines.  Any blood disorders you have.  Any surgeries you have had.  Any medical conditions you have, such as sleep apnea.  Whether you are pregnant or may be pregnant.  Any use of cigarettes, alcohol, or street drugs. What are the risks? Generally, this is a safe procedure. However, problems may occur, including:  Getting too much medicine (oversedation).  Nausea.  Allergic reaction to medicines.  Trouble breathing. If this happens, a breathing tube may be used to help with breathing. It will be removed when you are awake and breathing on your own.  Heart trouble.  Lung trouble.  Before the procedure Staying hydrated Follow instructions from your health care provider about hydration, which may include:  Up to 2 hours before the procedure - you may continue to drink clear liquids, such as water, clear fruit juice, black coffee, and plain tea.  Eating and drinking restrictions Follow instructions from your health care provider about eating and drinking, which may include:  8 hours before the procedure - stop eating heavy meals or foods such as meat, fried foods, or fatty foods.  6 hours before the procedure - stop eating light meals or foods, such as toast or cereal.  6 hours before the procedure - stop drinking milk or drinks that  contain milk.  2 hours before the procedure - stop drinking clear liquids.  Medicines Ask your health care provider about:  Changing or stopping your regular medicines. This is especially important if you are taking diabetes medicines or blood thinners.  Taking medicines such as aspirin and ibuprofen. These medicines can thin your blood. Do not take these medicines before your procedure if your health care provider instructs you not to.  Tests and exams  You will have a physical exam.  You may have blood tests done to show: ? How well your kidneys and liver are working. ? How well your blood can clot.  General instructions  Plan to have someone take you home from the hospital or clinic.  If you will be going home right after the procedure, plan to have someone with you for 24 hours.  What happens during the procedure?  Your blood pressure, heart rate, breathing, level of pain and overall condition will be monitored.  An IV tube will be inserted into one of your veins.  Your anesthesia specialist will give you medicines as needed to keep you comfortable during the procedure. This may mean changing the level of sedation.  The procedure will be performed. After the procedure  Your blood pressure, heart rate, breathing rate, and blood oxygen level will be monitored until the medicines you were given have worn off.  Do not drive for 24 hours if you received a sedative.  You may: ? Feel sleepy, clumsy, or nauseous. ? Feel forgetful about what happened after the procedure. ? Have a sore throat if you had a breathing tube during the procedure. ? Vomit. This information is not intended to replace advice given to you by your health care provider. Make sure you discuss any questions you have with your health care provider. Document Released: 06/01/2005 Document Revised: 02/12/2016 Document Reviewed: 12/27/2015 Elsevier Interactive Patient Education  2018 [place], Care After These instructions provide you with information about caring for yourself after your procedure. Your health  care provider may also give you more specific instructions. Your treatment has been planned according to current medical practices, but problems sometimes occur. Call your health care provider if you have any problems or questions after your procedure. What can I expect after the procedure? After your procedure, it is common to:  Feel sleepy for several hours.  Feel clumsy and have poor balance for several hours.  Feel forgetful about what happened after the procedure.  Have poor judgment for several hours.  Feel nauseous or vomit.  Have a sore throat if you had a breathing tube during the procedure.  Follow these instructions at home: For at least 24 hours after the procedure:   Do not: ? Participate in activities in which you could fall or become injured. ? Drive. ? Use heavy machinery. ? Drink alcohol. ? Take sleeping pills or medicines that cause drowsiness. ? Make important decisions or sign legal documents. ? Take care of children on your own.  Rest. Eating and drinking  Follow the diet that is recommended by your health care provider.  If you vomit, drink water, juice, or soup when you can drink without vomiting.  Make sure you have little or no nausea before eating solid foods. General instructions  Have a responsible adult stay with you until you are awake and alert.  Take over-the-counter and prescription medicines only as told by your health care provider.  If you smoke, do not smoke without supervision.  Keep all follow-up visits as told by your health care provider. This is important. Contact a health care provider if:  You keep feeling nauseous or you keep vomiting.  You feel light-headed.  You develop a rash.  You have a fever. Get help right away if:  You have trouble breathing. This information is not  intended to replace advice given to you by your health care provider. Make sure you discuss any questions you have with your health care provider. Document Released: 12/27/2015 Document Revised: 04/27/2016 Document Reviewed: 12/27/2015 Elsevier Interactive Patient Education  [employer].

## 2017-07-13 ENCOUNTER — Encounter (HOSPITAL_COMMUNITY): Payer: Self-pay

## 2017-07-13 ENCOUNTER — Encounter (HOSPITAL_COMMUNITY)
Admission: RE | Admit: 2017-07-13 | Discharge: 2017-07-13 | Disposition: A | Discharge status: Home / Self Care | Payer: 59 | Source: Ambulatory Visit | Attending: Gastroenterology | Admitting: Gastroenterology

## 2017-07-13 ENCOUNTER — Other Ambulatory Visit: Payer: Self-pay

## 2017-07-13 DIAGNOSIS — Z888 Allergy status to other drugs, medicaments and biological substances status: Secondary | ICD-10-CM | POA: Insufficient documentation

## 2017-07-13 DIAGNOSIS — R9431 Abnormal electrocardiogram [ECG] [EKG]: Secondary | ICD-10-CM | POA: Diagnosis not present

## 2017-07-13 DIAGNOSIS — E039 Hypothyroidism, unspecified: Secondary | ICD-10-CM | POA: Insufficient documentation

## 2017-07-13 DIAGNOSIS — Z91040 Latex allergy status: Secondary | ICD-10-CM | POA: Diagnosis not present

## 2017-07-13 DIAGNOSIS — Z01818 Encounter for other preprocedural examination: Secondary | ICD-10-CM | POA: Diagnosis present

## 2017-07-13 DIAGNOSIS — Z79899 Other long term (current) drug therapy: Secondary | ICD-10-CM | POA: Diagnosis not present

## 2017-07-13 DIAGNOSIS — Z01812 Encounter for preprocedural laboratory examination: Secondary | ICD-10-CM | POA: Diagnosis not present

## 2017-07-13 DIAGNOSIS — D510 Vitamin B12 deficiency anemia due to intrinsic factor deficiency: Secondary | ICD-10-CM | POA: Insufficient documentation

## 2017-07-13 DIAGNOSIS — K589 Irritable bowel syndrome without diarrhea: Secondary | ICD-10-CM | POA: Diagnosis not present

## 2017-07-13 DIAGNOSIS — K219 Gastro-esophageal reflux disease without esophagitis: Secondary | ICD-10-CM | POA: Diagnosis not present

## 2017-07-13 DIAGNOSIS — I493 Ventricular premature depolarization: Secondary | ICD-10-CM | POA: Diagnosis not present

## 2017-07-13 LAB — CBC
HCT: 38.7 % (ref 36.0–46.0)
Hemoglobin: 12.2 g/dL (ref 12.0–15.0)
MCH: 27.5 pg (ref 26.0–34.0)
MCHC: 31.5 g/dL (ref 30.0–36.0)
MCV: 87.4 fL (ref 78.0–100.0)
PLATELETS: 334 10*3/uL (ref 150–400)
RBC: 4.43 MIL/uL (ref 3.87–5.11)
RDW: 13.9 % (ref 11.5–15.5)
WBC: 8.2 10*3/uL (ref 4.0–10.5)

## 2017-07-13 LAB — BASIC METABOLIC PANEL
ANION GAP: 7 (ref 5–15)
BUN: 10 mg/dL (ref 6–20)
CO2: 28 mmol/L (ref 22–32)
Calcium: 8.6 mg/dL — ABNORMAL LOW (ref 8.9–10.3)
Chloride: 102 mmol/L (ref 101–111)
Creatinine, Ser: 0.87 mg/dL (ref 0.44–1.00)
Glucose, Bld: 124 mg/dL — ABNORMAL HIGH (ref 65–99)
POTASSIUM: 3.7 mmol/L (ref 3.5–5.1)
SODIUM: 137 mmol/L (ref 135–145)

## 2017-07-18 ENCOUNTER — Ambulatory Visit (HOSPITAL_COMMUNITY): Payer: 59 | Admitting: Certified Registered Nurse Anesthetist

## 2017-07-18 ENCOUNTER — Ambulatory Visit (HOSPITAL_COMMUNITY)
Admission: RE | Admit: 2017-07-18 | Discharge: 2017-07-18 | Disposition: A | Discharge status: Home / Self Care | Payer: 59 | Source: Ambulatory Visit | Attending: Gastroenterology | Admitting: Gastroenterology

## 2017-07-18 ENCOUNTER — Encounter (HOSPITAL_COMMUNITY): Payer: Self-pay | Admitting: Gastroenterology

## 2017-07-18 ENCOUNTER — Encounter (HOSPITAL_COMMUNITY)
Admission: RE | Disposition: A | Discharge status: Home / Self Care | Payer: Self-pay | Source: Ambulatory Visit | Attending: Gastroenterology

## 2017-07-18 DIAGNOSIS — K5790 Diverticulosis of intestine, part unspecified, without perforation or abscess without bleeding: Secondary | ICD-10-CM | POA: Diagnosis not present

## 2017-07-18 DIAGNOSIS — K219 Gastro-esophageal reflux disease without esophagitis: Secondary | ICD-10-CM | POA: Diagnosis not present

## 2017-07-18 DIAGNOSIS — Z1211 Encounter for screening for malignant neoplasm of colon: Secondary | ICD-10-CM | POA: Insufficient documentation

## 2017-07-18 DIAGNOSIS — G4730 Sleep apnea, unspecified: Secondary | ICD-10-CM | POA: Diagnosis not present

## 2017-07-18 DIAGNOSIS — Z6841 Body Mass Index (BMI) 40.0 and over, adult: Secondary | ICD-10-CM | POA: Insufficient documentation

## 2017-07-18 DIAGNOSIS — Z1212 Encounter for screening for malignant neoplasm of rectum: Secondary | ICD-10-CM | POA: Diagnosis not present

## 2017-07-18 DIAGNOSIS — K589 Irritable bowel syndrome without diarrhea: Secondary | ICD-10-CM | POA: Diagnosis not present

## 2017-07-18 DIAGNOSIS — K644 Residual hemorrhoidal skin tags: Secondary | ICD-10-CM | POA: Diagnosis not present

## 2017-07-18 DIAGNOSIS — D510 Vitamin B12 deficiency anemia due to intrinsic factor deficiency: Secondary | ICD-10-CM | POA: Insufficient documentation

## 2017-07-18 DIAGNOSIS — Z91040 Latex allergy status: Secondary | ICD-10-CM | POA: Diagnosis not present

## 2017-07-18 DIAGNOSIS — I493 Ventricular premature depolarization: Secondary | ICD-10-CM | POA: Diagnosis not present

## 2017-07-18 DIAGNOSIS — Q438 Other specified congenital malformations of intestine: Secondary | ICD-10-CM | POA: Insufficient documentation

## 2017-07-18 DIAGNOSIS — Z79899 Other long term (current) drug therapy: Secondary | ICD-10-CM | POA: Insufficient documentation

## 2017-07-18 DIAGNOSIS — E039 Hypothyroidism, unspecified: Secondary | ICD-10-CM | POA: Diagnosis not present

## 2017-07-18 DIAGNOSIS — K648 Other hemorrhoids: Secondary | ICD-10-CM | POA: Insufficient documentation

## 2017-07-18 DIAGNOSIS — Z888 Allergy status to other drugs, medicaments and biological substances status: Secondary | ICD-10-CM | POA: Diagnosis not present

## 2017-07-18 DIAGNOSIS — K7581 Nonalcoholic steatohepatitis (NASH): Secondary | ICD-10-CM | POA: Diagnosis not present

## 2017-07-18 HISTORY — PX: COLONOSCOPY WITH PROPOFOL: SHX5780

## 2017-07-18 SURGERY — COLONOSCOPY WITH PROPOFOL
Anesthesia: Monitor Anesthesia Care

## 2017-07-18 MED ORDER — PROPOFOL 10 MG/ML IV BOLUS
INTRAVENOUS | Status: AC
  Filled 2017-07-18: qty 20

## 2017-07-18 MED ORDER — FENTANYL CITRATE (PF) 100 MCG/2ML IJ SOLN
INTRAMUSCULAR | Status: AC
  Filled 2017-07-18: qty 2

## 2017-07-18 MED ORDER — CHLORHEXIDINE GLUCONATE CLOTH 2 % EX PADS: 6.0000 | MEDICATED_PAD | Freq: Once | CUTANEOUS | Status: DC

## 2017-07-18 MED ORDER — PROPOFOL 10 MG/ML IV BOLUS
INTRAVENOUS | Status: DC | PRN
  Administered 2017-07-18: 50 mg via INTRAVENOUS

## 2017-07-18 MED ORDER — LACTATED RINGERS IV SOLN
INTRAVENOUS | Status: DC
  Administered 2017-07-18: 1000 mL via INTRAVENOUS

## 2017-07-18 MED ORDER — FENTANYL CITRATE (PF) 100 MCG/2ML IJ SOLN
25.0000 ug | Freq: Once | INTRAMUSCULAR | Status: AC
  Administered 2017-07-18: 25 ug via INTRAVENOUS

## 2017-07-18 MED ORDER — LIDOCAINE HCL (CARDIAC) 20 MG/ML IV SOLN
INTRAVENOUS | Status: DC | PRN
  Administered 2017-07-18: 50 mg via INTRAVENOUS

## 2017-07-18 MED ORDER — MIDAZOLAM HCL 2 MG/2ML IJ SOLN
1.0000 mg | INTRAMUSCULAR | Status: AC
  Administered 2017-07-18: 2 mg via INTRAVENOUS

## 2017-07-18 MED ORDER — PROPOFOL 500 MG/50ML IV EMUL
INTRAVENOUS | Status: DC | PRN
  Administered 2017-07-18: 100 ug/kg/min via INTRAVENOUS

## 2017-07-18 MED ORDER — MIDAZOLAM HCL 2 MG/2ML IJ SOLN
INTRAMUSCULAR | Status: AC
  Filled 2017-07-18: qty 2

## 2017-07-18 NOTE — Discharge Instructions (Signed)
You DID NOT HAVE ANY POLYPS. YOU HAVE DIVERTICULOSIS IN YOUR RIGHT COLON. You have SMALL internal AND MODERATE EXTERNAL hemorrhoids.   DRINK WATER TO KEEP URINE LIGHT YELLOW.  FOLLOW A HIGH FIBER DIET. AVOID ITEMS THAT CAUSE BLOATING & GAS. SEE INFO BELOW.  CONTINUE YOUR WEIGHT LOSS EFFORTS.  A WEIGHT OF 174 LBS  WILL GET YOUR BODY MASS INDEX(BMI) UNDER 30.  CONTINUE ZEGERID IF NEEDED TO CONTROL HEARTBURN.    FOLLOW UP IN 6 MOS. With Rachel Branch April 29 at 10:00am.  Next colonoscopy in 10 years.   Colonoscopy Care After Read the instructions outlined below and refer to this sheet in the next week. These discharge instructions provide you with general information on caring for yourself after you leave the hospital. While your treatment has been planned according to the most current medical practices available, unavoidable complications occasionally occur. If you have any problems or questions after discharge, call DR. FIELDS, [phone].  ACTIVITY  You may resume your regular activity, but move at a slower pace for the next 24 hours.   Take frequent rest periods for the next 24 hours.   Walking will help get rid of the air and reduce the bloated feeling in your belly (abdomen).   No driving for 24 hours (because of the medicine (anesthesia) used during the test).   You may shower.   Do not sign any important legal documents or operate any machinery for 24 hours (because of the anesthesia used during the test).    NUTRITION  Drink plenty of fluids.   You may resume your normal diet as instructed by your doctor.   Begin with a light meal and progress to your normal diet. Heavy or fried foods are harder to digest and may make you feel sick to your stomach (nauseated).   Avoid alcoholic beverages for 24 hours or as instructed.    MEDICATIONS  You may resume your normal medications.   WHAT YOU CAN EXPECT TODAY  Some feelings of bloating in the abdomen.   Passage  of more gas than usual.   Spotting of blood in your stool or on the toilet paper  .  IF YOU HAD POLYPS REMOVED DURING THE COLONOSCOPY:  Eat a soft diet IF YOU HAVE NAUSEA, BLOATING, ABDOMINAL PAIN, OR VOMITING.    FINDING OUT THE RESULTS OF YOUR TEST Not all test results are available during your visit. DR. Oneida Alar WILL CALL YOU WITHIN 7 DAYS OF YOUR PROCEDUE WITH YOUR RESULTS. Do not assume everything is normal if you have not heard from DR. FIELDS IN ONE WEEK, CALL HER OFFICE AT [phone].  SEEK IMMEDIATE MEDICAL ATTENTION AND CALL THE OFFICE: [phone] IF:  You have more than a spotting of blood in your stool.   Your belly is swollen (abdominal distention).   You are nauseated or vomiting.   You have a temperature over 101F.   You have abdominal pain or discomfort that is severe or gets worse throughout the day.    High-Fiber Diet A high-fiber diet changes your normal diet to include more whole grains, legumes, fruits, and vegetables. Changes in the diet involve replacing refined carbohydrates with unrefined foods. The calorie level of the diet is essentially unchanged. The Dietary Reference Intake (recommended amount) for adult males is 38 grams per day. For adult females, it is 25 grams per day. Pregnant and lactating women should consume 28 grams of fiber per day. Fiber is the intact part of a plant that is not  broken down during digestion. Functional fiber is fiber that has been isolated from the plant to provide a beneficial effect in the body. PURPOSE  Increase stool bulk.   Ease and regulate bowel movements.   Lower cholesterol.   REDUCE RISK OF COLON CANCER  INDICATIONS THAT YOU NEED MORE FIBER  Constipation and hemorrhoids.   Uncomplicated diverticulosis (intestine condition) and irritable bowel syndrome.   Weight management.   As a protective measure against hardening of the arteries (atherosclerosis), diabetes, and cancer.   GUIDELINES FOR  INCREASING FIBER IN THE DIET  Start adding fiber to the diet slowly. A gradual increase of about 5 more grams (2 slices of whole-wheat bread, 2 servings of most fruits or vegetables, or 1 bowl of high-fiber cereal) per day is best. Too rapid an increase in fiber may result in constipation, flatulence, and bloating.   Drink enough water and fluids to keep your urine clear or pale yellow. Water, juice, or caffeine-free drinks are recommended. Not drinking enough fluid may cause constipation.   Eat a variety of high-fiber foods rather than one type of fiber.   Try to increase your intake of fiber through using high-fiber foods rather than fiber pills or supplements that contain small amounts of fiber.   The goal is to change the types of food eaten. Do not supplement your present diet with high-fiber foods, but replace foods in your present diet.   INCLUDE A VARIETY OF FIBER SOURCES  Replace refined and processed grains with whole grains, canned fruits with fresh fruits, and incorporate other fiber sources. White rice, white breads, and most bakery goods contain little or no fiber.   Brown whole-grain rice, buckwheat oats, and many fruits and vegetables are all good sources of fiber. These include: broccoli, Brussels sprouts, cabbage, cauliflower, beets, sweet potatoes, white potatoes (skin on), carrots, tomatoes, eggplant, squash, berries, fresh fruits, and dried fruits.   Cereals appear to be the richest source of fiber. Cereal fiber is found in whole grains and bran. Bran is the fiber-rich outer coat of cereal grain, which is largely removed in refining. In whole-grain cereals, the bran remains. In breakfast cereals, the largest amount of fiber is found in those with "bran" in their names. The fiber content is sometimes indicated on the label.   You may need to include additional fruits and vegetables each day.   In baking, for 1 cup white flour, you may use the following substitutions:   1  cup whole-wheat flour minus 2 tablespoons.   1/2 cup white flour plus 1/2 cup whole-wheat flour.   Diverticulosis Diverticulosis is a common condition that develops when small pouches (diverticula) form in the wall of the colon. The risk of diverticulosis increases with age. It happens more often in people who eat a low-fiber diet. Most individuals with diverticulosis have no symptoms. Those individuals with symptoms usually experience belly (abdominal) pain, constipation, or loose stools (diarrhea).  HOME CARE INSTRUCTIONS  Increase the amount of fiber in your diet as directed by your caregiver or dietician. This may reduce symptoms of diverticulosis.   Drink at least 6 to 8 glasses of water each day to prevent constipation.   Try not to strain when you have a bowel movement.   THERE IS NO NEED TO Avoid nuts and seeds to prevent complications.   FOODS HAVING HIGH FIBER CONTENT INCLUDE:  Fruits. Apple, peach, pear, tangerine, raisins, prunes.   Vegetables. Brussels sprouts, asparagus, broccoli, cabbage, carrot, cauliflower, romaine lettuce, spinach, summer squash,  tomato, winter squash, zucchini.   Starchy Vegetables. Baked beans, kidney beans, lima beans, split peas, lentils, potatoes (with skin).   Grains. Whole wheat bread, brown rice, bran flake cereal, plain oatmeal, white rice, shredded wheat, bran muffins.    Hemorrhoids Hemorrhoids are dilated (enlarged) veins around the rectum. Sometimes clots will form in the veins. This makes them swollen and painful. These are called thrombosed hemorrhoids. Causes of hemorrhoids include:  Constipation.   Straining to have a bowel movement.   HEAVY LIFTING  HOME CARE INSTRUCTIONS  Eat a well balanced diet and drink 6 to 8 glasses of water every day to avoid constipation. You may also use a bulk laxative.   Avoid straining to have bowel movements.   Keep anal area dry and clean.   Do not use a donut shaped pillow or sit on the  toilet for long periods. This increases blood pooling and pain.   Move your bowels when your body has the urge; this will require less straining and will decrease pain and pressure.

## 2017-07-18 NOTE — Anesthesia Preprocedure Evaluation (Signed)
Anesthesia Evaluation  Patient identified by MRN, date of birth, ID band Patient awake    Reviewed: Allergy & Precautions, NPO status , Patient's Chart, lab work & pertinent test results  Airway Mallampati: IV  TM Distance: >3 FB Neck ROM: Full  Mouth opening: Limited Mouth Opening  Dental no notable dental hx. (+) Teeth Intact, Dental Advisory Given   Pulmonary sleep apnea ,  Snores- ?undiagnosed OSA   Pulmonary exam normal breath sounds clear to auscultation       Cardiovascular negative cardio ROS Normal cardiovascular exam Rhythm:Regular Rate:Normal     Neuro/Psych negative neurological ROS  negative psych ROS   GI/Hepatic negative GI ROS, Neg liver ROS, GERD  Medicated,(+) Hepatitis - ([facility])Pernicious anemia   Endo/Other  Hypothyroidism Morbid obesity  Renal/GU negative Renal ROS  negative genitourinary   Musculoskeletal negative musculoskeletal ROS (+)   Abdominal   Peds  Hematology  (+) anemia ,   Anesthesia Other Findings   Reproductive/Obstetrics                             Anesthesia Physical Anesthesia Plan  ASA: III  Anesthesia Plan: MAC   Post-op Pain Management:    Induction: Intravenous  PONV Risk Score and Plan:   Airway Management Planned: Simple Face Mask  Additional Equipment:   Intra-op Plan:   Post-operative Plan:   Informed Consent: I have reviewed the patients History and Physical, chart, labs and discussed the procedure including the risks, benefits and alternatives for the proposed anesthesia with the patient or authorized representative who has indicated his/her understanding and acceptance.     Plan Discussed with:   Anesthesia Plan Comments:         Anesthesia Quick Evaluation

## 2017-07-18 NOTE — Addendum Note (Signed)
Addendum  created 07/18/17 1243 by Vista Deck, CRNA   Charge Capture section accepted

## 2017-07-18 NOTE — Anesthesia Postprocedure Evaluation (Signed)
Anesthesia Post Note  Patient: Rachel Branch  Procedure(s) Performed: COLONOSCOPY WITH PROPOFOL (N/A )  Patient location during evaluation: PACU Anesthesia Type: MAC Level of consciousness: awake and alert Pain management: satisfactory to patient Vital Signs Assessment: post-procedure vital signs reviewed and stable Respiratory status: spontaneous breathing Cardiovascular status: stable Postop Assessment: no apparent nausea or vomiting Anesthetic complications: no     Last Vitals:  Vitals:   07/18/17 0845 07/18/17 0900  BP: 113/66 115/72  Pulse: 80 67  Resp: 19 17  Temp: 36.4 C   SpO2: 94% 96%    Last Pain: There were no vitals filed for this visit.               Drucie Opitz

## 2017-07-18 NOTE — H&P (View-Only) (Signed)
Subjective:    Patient ID: Rachel Branch, female    DOB: Aug 06, 1956, 61 y.o.   MRN: [id number]  PCP: Celene Squibb.   HPI RARE HEARTBURN OR INDIGESTION, BUT HAD GB TAKEN OUT. OFF OMEPRAZOLE. CHANGED FROM GREEN TO PINK FOR ONE SHE HAD A RASH BUT TOLERATES OMEPRAZOLE. TAKES ONE ZEGERID IF NEEDED. TAKES GAS-X ABOUT EVERY DAY. BMs: ABOUT EVERY OTHER DAY, NL PATTERN. NEW GB BOY: AUG 4, JONAH.  PT DENIES FEVER, CHILLS, HEMATOCHEZIA, nausea, vomiting, melena, diarrhea, CHEST PAIN, SHORTNESS OF BREATH, CHANGE IN BOWEL IN HABITS, constipation, abdominal pain, problems swallowing, OR problems with sedation.  Past Medical History:  Diagnosis Date  . GERD (gastroesophageal reflux disease)   . Hypothyroidism   . IBS (irritable bowel syndrome)   . Pernicious anemia    B12 injections every other week   Past Surgical History:  Procedure Laterality Date  . ABDOMINAL HYSTERECTOMY    . CHOLECYSTECTOMY N/A 10/24/2016     . COMBINED HYSTEROSCOPY DIAGNOSTIC / D&C  03/14/11   benign endometrial polyps  . CYSTOSCOPY N/A 07/28/2015   Procedure: CYSTOSCOPY;  Surgeon: Anastasio Auerbach, MD;  Location: [facility];  Service: Gynecology;  Laterality: N/A;  . DILATATION & CURETTAGE/HYSTEROSCOPY WITH MYOSURE N/A 12/02/2014   Procedure: DILATATION & CURETTAGE/HYSTEROSCOPY WITH MYOSURE;  Surgeon: Anastasio Auerbach, MD;  Location: [facility];  Service: Gynecology;  Laterality: N/A;  . DILATATION & CURETTAGE/HYSTEROSCOPY WITH TRUECLEAR N/A 09/18/2013   Procedure: DILATATION & CURETTAGE/HYSTEROSCOPY WITH TRUCLEAR;  Surgeon: Anastasio Auerbach, MD;  Location: [facility];  Service: Gynecology;  Laterality: N/A;  . DILATION AND CURETTAGE OF UTERUS  2007  . LAPAROSCOPIC ASSISTED VAGINAL HYSTERECTOMY N/A 07/28/2015   Procedure: LAPAROSCOPIC ASSISTED VAGINAL HYSTERECTOMY;  Surgeon: Anastasio Auerbach, MD;  Location: [facility];  Service: Gynecology;  Laterality: N/A;  . LAPAROSCOPIC BILATERAL SALPINGO OOPHERECTOMY Bilateral 07/28/2015   Procedure: LAPAROSCOPIC BILATERAL SALPINGO OOPHORECTOMY;  Surgeon: Anastasio Auerbach, MD;  Location: [facility];  Service: Gynecology;  Laterality: Bilateral;  . LAPAROSCOPIC LYSIS OF ADHESIONS N/A 07/28/2015   Procedure: LAPAROSCOPIC LYSIS OF ADHESIONS;  Surgeon: Anastasio Auerbach, MD;  Location: [facility];  Service: Gynecology;  Laterality: N/A;  . LIVER BIOPSY N/A 10/24/2016   Procedure: LIVER BIOPSY;  Surgeon: Aviva Signs, MD;  Location: [facility];  Service: General;  Laterality: N/A;  . TUBAL LIGATION  1997    Allergies  Allergen Reactions  . Latex Rash    Mouth ulcers after dental work with Latex gloves.  . Omeprazole     RASH  . Betadine [Povidone Iodine] Itching and Rash    Current Outpatient Prescriptions  Medication Sig Dispense Refill  . cetirizine (ZYRTEC) 10 MG tablet Take 10 mg by mouth daily.    . Cholecalciferol (VITAMIN D) 2000 units tablet Take 2,000 Units by mouth daily.     . Cyanocobalamin 1000 MCG/ML KIT Inject 1 mL as directed every 14 (fourteen) days.    Marland Kitchen levothyroxine (SYNTHROID, LEVOTHROID) 112 MCG tablet TAKE 1 TABLET(112 MCG) BY MOUTH DAILY BEFORE BREAKFAST    . loperamide (IMODIUM) 2 MG capsule Take by mouth as needed for diarrhea or loose stools.    Marland Kitchen omeprazole-sodium bicarbonate (ZEGERID) 40-1100 MG capsule Take 1 capsule by mouth as needed.)    . Simethicone (GAS-X PO) Take 1 capsule by mouth daily as needed (gas).     .      .      .      Marland Kitchen  Review of Systems PER HPI OTHERWISE ALL SYSTEMS ARE NEGATIVE.    Objective:   Physical Exam  Constitutional: She is oriented to person, place, and time. She appears well-developed and well-nourished. No distress.  HENT:  Head: Normocephalic and atraumatic.  Mouth/Throat: Oropharynx is clear and moist. No oropharyngeal exudate.  Eyes: Pupils are equal, round, and reactive to light. No scleral icterus.  Neck: Normal range of motion. Neck supple.  Cardiovascular: Normal rate, regular rhythm and normal heart  sounds.   Pulmonary/Chest: Effort normal and breath sounds normal. No respiratory distress.  Abdominal: Soft. Bowel sounds are normal. She exhibits no distension. There is no tenderness.  Musculoskeletal: She exhibits no edema.  Lymphadenopathy:    She has no cervical adenopathy.  Neurological: She is alert and oriented to person, place, and time.  NO  NEW FOCAL DEFICITS  Psychiatric:  FLAT AFFECT, ANXIOUS MOOD   Vitals reviewed.      Assessment & Plan:

## 2017-07-18 NOTE — Op Note (Signed)
[facility] Patient Name: Rachel Branch Procedure Date: 07/18/2017 7:57 AM MRN: [id number] Date of Birth: May 25, 1956 Attending MD: Barney Drain MD, MD CSN: [id number] Age: 61 Admit Type: Outpatient Procedure:                Colonoscopy,SCREENING Indications:              Screening for colorectal malignant neoplasm, BMI 40 Providers:                Barney Drain MD, MD, Janeece Riggers, RN, Selena Lesser, Randa Spike, Technician Referring MD:             Edwinna Areola. Hall MD Medicines:                Propofol per Anesthesia Complications:            No immediate complications. Estimated Blood Loss:     Estimated blood loss: none. Procedure:                Pre-Anesthesia Assessment:                           - Prior to the procedure, a History and Physical                            was performed, and patient medications and                            allergies were reviewed. The patient's tolerance of                            previous anesthesia was also reviewed. The risks                            and benefits of the procedure and the sedation                            options and risks were discussed with the patient.                            All questions were answered, and informed consent                            was obtained. Prior Anticoagulants: The patient has                            taken no previous anticoagulant or antiplatelet                            agents. ASA Grade Assessment: II - A patient with                            mild systemic disease. After reviewing the risks  and benefits, the patient was deemed in                            satisfactory condition to undergo the procedure.                            After obtaining informed consent, the colonoscope                            was passed under direct vision. Throughout the                            procedure, the patient's blood pressure,  pulse, and                            oxygen saturations were monitored continuously. The                            EC-[id number] ([id number]) scope was introduced through                            the anus and advanced to the the cecum, identified                            by appendiceal orifice and ileocecal valve. The                            colonoscopy was somewhat difficult due to a                            tortuous colon. Successful completion of the                            procedure was aided by COLOWRAP. The patient                            tolerated the procedure fairly well. The quality of                            the bowel preparation was excellent. The ileocecal                            valve, appendiceal orifice, and rectum were                            photographed. Scope In: 8:25:04 AM Scope Out: 8:35:13 AM Scope Withdrawal Time: 0 hours 8 minutes 5 seconds  Total Procedure Duration: 0 hours 10 minutes 9 seconds  Findings:      The digital rectal exam findings include non-thrombosed external       hemorrhoids.      Multiple small and large-mouthed diverticula were found in the splenic       flexure and hepatic flexure.      The recto-sigmoid colon and sigmoid colon were moderately redundant.  Internal hemorrhoids were found during retroflexion. The hemorrhoids       were small. Impression:               - Non-thrombosed external hemorrhoids found on                            digital rectal exam.                           - Diverticulosis at the splenic flexure and at the                            hepatic flexure.                           - Redundant LEFT colon. Moderate Sedation:      Per Anesthesia Care Recommendation:           - High fiber diet. CONTINUE WEIGHT LOSS EFFORTS.                           - Continue present medications.                           - Repeat colonoscopy in 10 years for surveillance.                           - Patient has  a contact number available for                            emergencies. The signs and symptoms of potential                            delayed complications were discussed with the                            patient. Return to normal activities tomorrow.                            Written discharge instructions were provided to the                            patient. Procedure Code(s):        --- Professional ---                           [phone], Colonoscopy, flexible; diagnostic, including                            collection of specimen(s) by brushing or washing,                            when performed (separate procedure) Diagnosis Code(s):        --- Professional ---                           Z12.11, Encounter for  screening for malignant                            neoplasm of colon                           K64.4, Residual hemorrhoidal skin tags                           K57.30, Diverticulosis of large intestine without                            perforation or abscess without bleeding                           Q43.8, Other specified congenital malformations of                            intestine CPT copyright 2016 American Medical Association. All rights reserved. The codes documented in this report are preliminary and upon coder review may  be revised to meet current compliance requirements. Barney Drain, MD Barney Drain MD, MD 07/18/2017 8:44:49 AM This report has been signed electronically. Number of Addenda: 0

## 2017-07-18 NOTE — Transfer of Care (Signed)
Immediate Anesthesia Transfer of Care Note  Patient: Rachel Branch  Procedure(s) Performed: COLONOSCOPY WITH PROPOFOL (N/A )  Patient Location: PACU  Anesthesia Type:MAC  Level of Consciousness: awake, alert  and oriented  Airway & Oxygen Therapy: Patient Spontanous Breathing and Patient connected to nasal cannula oxygen  Post-op Assessment: Report given to RN, Post -op Vital signs reviewed and stable and Patient moving all extremities X 4  Post vital signs: Reviewed and stable  Last Vitals:  Vitals:   07/18/17 0805 07/18/17 0810  BP: 130/70 (!) 162/80  Resp: 20 19  Temp:    SpO2: 95% 94%    Last Pain: There were no vitals filed for this visit.       Complications: No apparent anesthesia complications

## 2017-07-18 NOTE — Interval H&P Note (Signed)
History and Physical Interval Note:  07/18/2017 7:57 AM  [name]  has presented today for surgery, with the diagnosis of screening colonoscopy  The various methods of treatment have been discussed with the patient and family. After consideration of risks, benefits and other options for treatment, the patient has consented to  Procedure(s) with comments: COLONOSCOPY WITH PROPOFOL (N/A) - 8:15am as a surgical intervention .  The patient's history has been reviewed, patient examined, no change in status, stable for surgery.  I have reviewed the patient's chart and labs.  Questions were answered to the patient's satisfaction.     [employer]

## 2017-07-19 ENCOUNTER — Encounter (HOSPITAL_COMMUNITY): Payer: Self-pay | Admitting: Gastroenterology

## 2017-08-03 DIAGNOSIS — Z23 Encounter for immunization: Secondary | ICD-10-CM | POA: Diagnosis not present

## 2017-09-04 DIAGNOSIS — D519 Vitamin B12 deficiency anemia, unspecified: Secondary | ICD-10-CM | POA: Diagnosis not present

## 2017-09-04 DIAGNOSIS — Z23 Encounter for immunization: Secondary | ICD-10-CM | POA: Diagnosis not present

## 2017-09-04 DIAGNOSIS — E039 Hypothyroidism, unspecified: Secondary | ICD-10-CM | POA: Diagnosis not present

## 2017-09-06 DIAGNOSIS — E039 Hypothyroidism, unspecified: Secondary | ICD-10-CM | POA: Diagnosis not present

## 2017-09-07 ENCOUNTER — Other Ambulatory Visit (HOSPITAL_COMMUNITY): Payer: Self-pay | Admitting: Internal Medicine

## 2017-09-07 DIAGNOSIS — Z1231 Encounter for screening mammogram for malignant neoplasm of breast: Secondary | ICD-10-CM

## 2017-09-07 DIAGNOSIS — E042 Nontoxic multinodular goiter: Secondary | ICD-10-CM

## 2017-09-07 DIAGNOSIS — E0590 Thyrotoxicosis, unspecified without thyrotoxic crisis or storm: Secondary | ICD-10-CM

## 2017-09-14 ENCOUNTER — Ambulatory Visit (HOSPITAL_COMMUNITY)
Admission: RE | Admit: 2017-09-14 | Discharge: 2017-09-14 | Disposition: A | Discharge status: Home / Self Care | Payer: 59 | Source: Ambulatory Visit | Attending: Internal Medicine | Admitting: Internal Medicine

## 2017-09-14 DIAGNOSIS — E041 Nontoxic single thyroid nodule: Secondary | ICD-10-CM | POA: Insufficient documentation

## 2017-09-14 DIAGNOSIS — E0590 Thyrotoxicosis, unspecified without thyrotoxic crisis or storm: Secondary | ICD-10-CM | POA: Diagnosis not present

## 2017-09-14 DIAGNOSIS — E042 Nontoxic multinodular goiter: Secondary | ICD-10-CM

## 2017-09-22 ENCOUNTER — Ambulatory Visit (HOSPITAL_COMMUNITY)
Admission: RE | Admit: 2017-09-22 | Discharge: 2017-09-22 | Disposition: A | Discharge status: Home / Self Care | Payer: 59 | Source: Ambulatory Visit | Attending: Internal Medicine | Admitting: Internal Medicine

## 2017-09-22 DIAGNOSIS — Z1231 Encounter for screening mammogram for malignant neoplasm of breast: Secondary | ICD-10-CM | POA: Diagnosis not present

## 2017-09-26 DIAGNOSIS — T781XXA Other adverse food reactions, not elsewhere classified, initial encounter: Secondary | ICD-10-CM | POA: Diagnosis not present

## 2017-09-26 DIAGNOSIS — J309 Allergic rhinitis, unspecified: Secondary | ICD-10-CM | POA: Diagnosis not present

## 2017-10-03 DIAGNOSIS — M25552 Pain in left hip: Secondary | ICD-10-CM | POA: Diagnosis not present

## 2017-10-03 DIAGNOSIS — M7062 Trochanteric bursitis, left hip: Secondary | ICD-10-CM | POA: Diagnosis not present

## 2017-12-19 ENCOUNTER — Other Ambulatory Visit: Payer: Self-pay | Admitting: Gynecology

## 2017-12-26 ENCOUNTER — Encounter: Payer: 59 | Admitting: Gynecology

## 2018-01-04 ENCOUNTER — Ambulatory Visit (INDEPENDENT_AMBULATORY_CARE_PROVIDER_SITE_OTHER): Payer: 59 | Admitting: Gynecology

## 2018-01-04 ENCOUNTER — Encounter: Payer: Self-pay | Admitting: Gynecology

## 2018-01-04 VITALS — BP 124/82 | Ht 66.0 in | Wt 240.0 lb

## 2018-01-04 DIAGNOSIS — Z01411 Encounter for gynecological examination (general) (routine) with abnormal findings: Secondary | ICD-10-CM | POA: Diagnosis not present

## 2018-01-04 DIAGNOSIS — N952 Postmenopausal atrophic vaginitis: Secondary | ICD-10-CM

## 2018-01-04 NOTE — Progress Notes (Signed)
    Rachel Branch 07-28-1956 [id number]        62 y.o.  [id number] for annual gynecologic exam.  Doing well without gynecologic complaints  Past medical history,surgical history, problem list, medications, allergies, family history and social history were all reviewed and documented as reviewed in the EPIC chart.  ROS:  Performed with pertinent positives and negatives included in the history, assessment and plan.   Additional significant findings : None   Exam: Rachel Branch assistant Vitals:   01/04/18 1405  BP: 124/82  Weight: 240 lb (108.9 kg)  Height: 5\' 6"  (1.676 m)   Body mass index is 38.74 kg/m.  General appearance:  Normal affect, orientation and appearance. Skin: Grossly normal HEENT: Without gross lesions.  No cervical or supraclavicular adenopathy. Thyroid normal.  Lungs:  Clear without wheezing, rales or rhonchi Cardiac: RR, without RMG Abdominal:  Soft, nontender, without masses, guarding, rebound, organomegaly or hernia Breasts:  Examined lying and sitting without masses, retractions, discharge or axillary adenopathy. Pelvic:  Ext, BUS, Vagina: Normal with atrophic changes  Adnexa: Without masses or tenderness    Anus and perineum: Normal   Rectovaginal: Normal sphincter tone without palpated masses or tenderness.    Assessment/Plan:  61 y.o. [id number] female for annual gynecologic exam status post LAVH BSO 2016 for irregular bleeding and adhesions..   1. Postmenopausal/atrophic genital changes.  No significant hot flushes, night sweats or vaginal dryness. 2. Mammography 09/2017.  Continue with annual mammography next year.  Breast exam normal today. 3. Pap smear 2018.  No Pap smear done today.  No history of abnormal Pap smears.  Options to stop screening per current screening guidelines based on hysterectomy history reviewed. 4. DEXA 2013 normal.  Plan repeat DEXA at age 62. 62. Colonoscopy 2018.  Repeat at their recommended interval. 6. Health maintenance.  No  routine lab work done as patient does this elsewhere.  Follow-up 1 year, sooner as needed.   [name], 2:36 PM 01/04/2018

## 2018-01-04 NOTE — Patient Instructions (Signed)
Follow-up in 1 year for annual exam, sooner if any issues. 

## 2018-01-15 ENCOUNTER — Ambulatory Visit: Payer: 59 | Admitting: Nurse Practitioner

## 2018-01-19 ENCOUNTER — Other Ambulatory Visit: Payer: Self-pay | Admitting: Gynecology

## 2018-01-19 NOTE — Telephone Encounter (Signed)
Patient called back. She said that she was going to ask her PCP to refill but lady that handles this is off until Monday. I explained that if Dr. Loetta Rough to refill he will need to check her TSH.  She said she is pretty sure that PCP did that with labs last time. She wants to wait and talk with PCP office on Monday regarding refill.

## 2018-01-19 NOTE — Telephone Encounter (Signed)
Received refill request for thyroid.  I was under the impression her primary physician was following this as we are doing no blood work.  If I am to refill this then she would need to have a TSH monitored.

## 2018-01-19 NOTE — Telephone Encounter (Signed)
Left message to call me with her daughter who answered at home.

## 2018-01-29 DIAGNOSIS — J019 Acute sinusitis, unspecified: Secondary | ICD-10-CM | POA: Diagnosis not present

## 2018-02-27 DIAGNOSIS — D519 Vitamin B12 deficiency anemia, unspecified: Secondary | ICD-10-CM | POA: Diagnosis not present

## 2018-02-27 DIAGNOSIS — E039 Hypothyroidism, unspecified: Secondary | ICD-10-CM | POA: Diagnosis not present

## 2018-03-07 DIAGNOSIS — D519 Vitamin B12 deficiency anemia, unspecified: Secondary | ICD-10-CM | POA: Diagnosis not present

## 2018-03-07 DIAGNOSIS — E039 Hypothyroidism, unspecified: Secondary | ICD-10-CM | POA: Diagnosis not present

## 2018-03-07 DIAGNOSIS — K219 Gastro-esophageal reflux disease without esophagitis: Secondary | ICD-10-CM | POA: Diagnosis not present

## 2018-05-07 DIAGNOSIS — H8149 Vertigo of central origin, unspecified ear: Secondary | ICD-10-CM | POA: Diagnosis not present

## 2018-05-07 DIAGNOSIS — H6990 Unspecified Eustachian tube disorder, unspecified ear: Secondary | ICD-10-CM | POA: Diagnosis not present

## 2018-06-18 DIAGNOSIS — J019 Acute sinusitis, unspecified: Secondary | ICD-10-CM | POA: Diagnosis not present

## 2018-06-18 DIAGNOSIS — R42 Dizziness and giddiness: Secondary | ICD-10-CM | POA: Diagnosis not present

## 2018-10-22 DIAGNOSIS — E782 Mixed hyperlipidemia: Secondary | ICD-10-CM | POA: Diagnosis not present

## 2018-10-22 DIAGNOSIS — D519 Vitamin B12 deficiency anemia, unspecified: Secondary | ICD-10-CM | POA: Diagnosis not present

## 2018-10-22 DIAGNOSIS — E039 Hypothyroidism, unspecified: Secondary | ICD-10-CM | POA: Diagnosis not present

## 2018-10-23 ENCOUNTER — Other Ambulatory Visit (HOSPITAL_COMMUNITY): Payer: Self-pay | Admitting: Physician Assistant

## 2018-10-23 ENCOUNTER — Other Ambulatory Visit (HOSPITAL_COMMUNITY): Payer: Self-pay | Admitting: Internal Medicine

## 2018-10-23 DIAGNOSIS — Z1231 Encounter for screening mammogram for malignant neoplasm of breast: Secondary | ICD-10-CM

## 2018-10-30 DIAGNOSIS — D519 Vitamin B12 deficiency anemia, unspecified: Secondary | ICD-10-CM | POA: Diagnosis not present

## 2018-10-30 DIAGNOSIS — K219 Gastro-esophageal reflux disease without esophagitis: Secondary | ICD-10-CM | POA: Diagnosis not present

## 2018-10-30 DIAGNOSIS — E039 Hypothyroidism, unspecified: Secondary | ICD-10-CM | POA: Diagnosis not present

## 2018-11-08 ENCOUNTER — Ambulatory Visit (HOSPITAL_COMMUNITY): Payer: 59

## 2018-11-12 ENCOUNTER — Ambulatory Visit (HOSPITAL_COMMUNITY)
Admission: RE | Admit: 2018-11-12 | Discharge: 2018-11-12 | Disposition: A | Discharge status: Home / Self Care | Payer: 59 | Source: Ambulatory Visit | Attending: Internal Medicine | Admitting: Internal Medicine

## 2018-11-12 DIAGNOSIS — Z1231 Encounter for screening mammogram for malignant neoplasm of breast: Secondary | ICD-10-CM | POA: Diagnosis not present

## 2018-12-11 DIAGNOSIS — K219 Gastro-esophageal reflux disease without esophagitis: Secondary | ICD-10-CM | POA: Diagnosis not present

## 2018-12-11 DIAGNOSIS — E039 Hypothyroidism, unspecified: Secondary | ICD-10-CM | POA: Diagnosis not present

## 2018-12-11 DIAGNOSIS — E559 Vitamin D deficiency, unspecified: Secondary | ICD-10-CM | POA: Diagnosis not present

## 2018-12-11 DIAGNOSIS — D519 Vitamin B12 deficiency anemia, unspecified: Secondary | ICD-10-CM | POA: Diagnosis not present

## 2018-12-11 DIAGNOSIS — M79671 Pain in right foot: Secondary | ICD-10-CM | POA: Diagnosis not present

## 2018-12-11 DIAGNOSIS — E782 Mixed hyperlipidemia: Secondary | ICD-10-CM | POA: Diagnosis not present

## 2019-01-07 ENCOUNTER — Encounter: Payer: 59 | Admitting: Gynecology

## 2019-03-14 ENCOUNTER — Encounter: Payer: 59 | Admitting: Gynecology

## 2019-06-18 ENCOUNTER — Encounter: Payer: Self-pay | Admitting: Gynecology

## 2019-12-26 ENCOUNTER — Other Ambulatory Visit (HOSPITAL_COMMUNITY): Payer: Self-pay | Admitting: Internal Medicine

## 2019-12-26 DIAGNOSIS — Z1231 Encounter for screening mammogram for malignant neoplasm of breast: Secondary | ICD-10-CM

## 2020-01-06 ENCOUNTER — Other Ambulatory Visit: Payer: Self-pay

## 2020-01-06 ENCOUNTER — Ambulatory Visit (HOSPITAL_COMMUNITY)
Admission: RE | Admit: 2020-01-06 | Discharge: 2020-01-06 | Disposition: A | Discharge status: Home / Self Care | Payer: 59 | Source: Ambulatory Visit | Attending: Internal Medicine | Admitting: Internal Medicine

## 2020-01-06 DIAGNOSIS — Z1231 Encounter for screening mammogram for malignant neoplasm of breast: Secondary | ICD-10-CM | POA: Insufficient documentation

## 2020-05-07 ENCOUNTER — Other Ambulatory Visit: Payer: Self-pay | Admitting: Internal Medicine

## 2020-05-07 ENCOUNTER — Other Ambulatory Visit (HOSPITAL_COMMUNITY): Payer: Self-pay | Admitting: Internal Medicine

## 2020-05-07 DIAGNOSIS — E039 Hypothyroidism, unspecified: Secondary | ICD-10-CM

## 2020-05-13 ENCOUNTER — Ambulatory Visit (HOSPITAL_COMMUNITY)
Admission: RE | Admit: 2020-05-13 | Discharge: 2020-05-13 | Disposition: A | Discharge status: Home / Self Care | Payer: 59 | Source: Ambulatory Visit | Attending: Internal Medicine | Admitting: Internal Medicine

## 2020-05-13 ENCOUNTER — Other Ambulatory Visit: Payer: Self-pay

## 2020-05-13 DIAGNOSIS — E039 Hypothyroidism, unspecified: Secondary | ICD-10-CM | POA: Insufficient documentation

## 2020-06-22 ENCOUNTER — Emergency Department (HOSPITAL_COMMUNITY): Payer: 59

## 2020-06-22 ENCOUNTER — Encounter (HOSPITAL_COMMUNITY): Payer: Self-pay | Admitting: Emergency Medicine

## 2020-06-22 ENCOUNTER — Other Ambulatory Visit: Payer: Self-pay

## 2020-06-22 ENCOUNTER — Emergency Department (HOSPITAL_COMMUNITY)
Admission: EM | Admit: 2020-06-22 | Discharge: 2020-06-22 | Disposition: A | Discharge status: Home / Self Care | Payer: 59 | Attending: Emergency Medicine | Admitting: Emergency Medicine

## 2020-06-22 DIAGNOSIS — M25512 Pain in left shoulder: Secondary | ICD-10-CM | POA: Insufficient documentation

## 2020-06-22 DIAGNOSIS — Z91040 Latex allergy status: Secondary | ICD-10-CM | POA: Insufficient documentation

## 2020-06-22 DIAGNOSIS — R079 Chest pain, unspecified: Secondary | ICD-10-CM | POA: Diagnosis not present

## 2020-06-22 DIAGNOSIS — E039 Hypothyroidism, unspecified: Secondary | ICD-10-CM | POA: Diagnosis not present

## 2020-06-22 DIAGNOSIS — Z79899 Other long term (current) drug therapy: Secondary | ICD-10-CM | POA: Insufficient documentation

## 2020-06-22 DIAGNOSIS — R110 Nausea: Secondary | ICD-10-CM | POA: Diagnosis not present

## 2020-06-22 LAB — CBC
HCT: 40.6 % (ref 36.0–46.0)
Hemoglobin: 12.8 g/dL (ref 12.0–15.0)
MCH: 28.5 pg (ref 26.0–34.0)
MCHC: 31.5 g/dL (ref 30.0–36.0)
MCV: 90.4 fL (ref 80.0–100.0)
Platelets: 328 10*3/uL (ref 150–400)
RBC: 4.49 MIL/uL (ref 3.87–5.11)
RDW: 14.4 % (ref 11.5–15.5)
WBC: 10.4 10*3/uL (ref 4.0–10.5)
nRBC: 0 % (ref 0.0–0.2)

## 2020-06-22 LAB — BASIC METABOLIC PANEL
Anion gap: 9 (ref 5–15)
BUN: 11 mg/dL (ref 8–23)
CO2: 28 mmol/L (ref 22–32)
Calcium: 9 mg/dL (ref 8.9–10.3)
Chloride: 102 mmol/L (ref 98–111)
Creatinine, Ser: 0.91 mg/dL (ref 0.44–1.00)
GFR calc Af Amer: >60 mL/min (ref >60)
GFR calc non Af Amer: >60 mL/min (ref >60)
Glucose, Bld: 97 mg/dL (ref 70–99)
Potassium: 4.1 mmol/L (ref 3.5–5.1)
Sodium: 139 mmol/L (ref 135–145)

## 2020-06-22 LAB — TROPONIN I (HIGH SENSITIVITY)
Troponin I (High Sensitivity): <2 ng/L (ref <18)
Troponin I (High Sensitivity): <2 ng/L (ref <18)

## 2020-06-22 NOTE — Discharge Instructions (Addendum)
Return to the ER for worsening or concerning symptoms otherwise recheck with your primary care provider.

## 2020-06-22 NOTE — ED Triage Notes (Signed)
Pt c/o of left sided arm pain and left jaw pain since last night

## 2020-06-22 NOTE — ED Provider Notes (Signed)
[facility] EMERGENCY DEPARTMENT Provider Note   CSN: [id number] Arrival date & time: 06/22/20  1148     History Chief Complaint  Patient presents with  . Chest Pain    Rachel Branch is a 64 y.o. female.  64 year old female with history of GERD, anemia, presents with complaint of a tightness in her left upper arm, left jaw/neck discomfort. Symptoms started last night, did have a headache at onset but headache did not last long and has resolved. Pain is not worse with exertion, movement, palpation. Reports nausea, denies diaphoresis, chest pain, shortness of breath. Patient had a B12 shot in this arm yesterday, has been receiving these injections for several years. No cardiac history, non smoker. No other complaints or concerns.      HPI: A 64 year old patient with a history of obesity presents for evaluation of chest pain. Initial onset of pain was more than 6 hours ago. The patient's chest pain is not worse with exertion. The patient complains of nausea. The patient's chest pain is not middle- or left-sided, is not well-localized, is not described as heaviness/pressure/tightness, is not sharp and does not radiate to the arms/jaw/neck. The patient denies diaphoresis. The patient has no history of stroke, has no history of peripheral artery disease, has not smoked in the past 90 days, denies any history of treated diabetes, has no relevant family history of coronary artery disease (first degree relative at less than age 7), is not hypertensive and has no history of hypercholesterolemia.   Past Medical History:  Diagnosis Date  . GERD (gastroesophageal reflux disease)   . Hypothyroidism   . IBS (irritable bowel syndrome)   . Pernicious anemia    B12 injections every other week    Patient Active Problem List   Diagnosis Date Noted  . Morbid obesity ([place]) 06/21/2017  . GERD (gastroesophageal reflux disease) 06/21/2017  . Encounter for screening colonoscopy 11/30/2016  .  Steatohepatitis, nonalcoholic 40/98/1191  . Anemia, iron deficiency 04/13/2016  . Cholecystitis with cholelithiasis 01/07/2016  . Abnormal LFTs 01/07/2016  . Irregular bleeding 07/28/2015  . Postmenopausal bleeding 08/23/2013    Past Surgical History:  Procedure Laterality Date  . ABDOMINAL HYSTERECTOMY    . CHOLECYSTECTOMY N/A 10/24/2016   Procedure: LAPAROSCOPIC CHOLECYSTECTOMY;  Surgeon: Aviva Signs, MD;  Location: [facility];  Service: General;  Laterality: N/A;  . COLONOSCOPY WITH PROPOFOL N/A 07/18/2017   Procedure: COLONOSCOPY WITH PROPOFOL;  Surgeon: Danie Binder, MD;  [facility];  Service: Endoscopy;  Laterality: N/A;  8:15am  . COMBINED HYSTEROSCOPY DIAGNOSTIC / D&C  03/14/11   benign endometrial polyps  . CYSTOSCOPY N/A 07/28/2015   Procedure: CYSTOSCOPY;  Surgeon: Anastasio Auerbach, MD;  Location: [facility];  Service: Gynecology;  Laterality: N/A;  . DILATATION & CURETTAGE/HYSTEROSCOPY WITH MYOSURE N/A 12/02/2014   Procedure: DILATATION & CURETTAGE/HYSTEROSCOPY WITH MYOSURE;  Surgeon: Anastasio Auerbach, MD;  Location: [facility];  Service: Gynecology;  Laterality: N/A;  . DILATATION & CURETTAGE/HYSTEROSCOPY WITH TRUECLEAR N/A 09/18/2013   Procedure: DILATATION & CURETTAGE/HYSTEROSCOPY WITH TRUCLEAR;  Surgeon: Anastasio Auerbach, MD;  Location: [facility];  Service: Gynecology;  Laterality: N/A;  . DILATION AND CURETTAGE OF UTERUS  2007  . LAPAROSCOPIC ASSISTED VAGINAL HYSTERECTOMY N/A 07/28/2015   Procedure: LAPAROSCOPIC ASSISTED VAGINAL HYSTERECTOMY;  Surgeon: Anastasio Auerbach, MD;  Location: [facility];  Service: Gynecology;  Laterality: N/A;  . LAPAROSCOPIC BILATERAL SALPINGO OOPHERECTOMY Bilateral 07/28/2015   Procedure: LAPAROSCOPIC BILATERAL SALPINGO OOPHORECTOMY;  Surgeon: Anastasio Auerbach,  MD;  Location: [facility];  Service: Gynecology;  Laterality: Bilateral;  . LAPAROSCOPIC LYSIS OF ADHESIONS N/A 07/28/2015   Procedure: LAPAROSCOPIC LYSIS OF ADHESIONS;  Surgeon: Anastasio Auerbach, MD;  Location: [facility];  Service: Gynecology;  Laterality: N/A;  . LIVER BIOPSY N/A 10/24/2016   Procedure: LIVER BIOPSY;  Surgeon: Aviva Signs, MD;  [facility]: [facility];  Service: General;  Laterality: N/A;  . TUBAL LIGATION  1997     OB History    Gravida  4   Para  3   Term      Preterm      AB  1   Living  3     SAB  1   TAB      Ectopic      Multiple      Live Births              Family History  Problem Relation Age of Onset  . Diabetes Mother   . Hypertension Mother   . Heart disease Mother   . Hypertension Father   . Diabetes Brother   . Cancer Brother        prostate  . Colon cancer Neg Hx     Social History   Tobacco Use  . Smoking status: Never Smoker  . Smokeless tobacco: Never Used  . Tobacco comment: Never smoked  Vaping Use  . Vaping Use: Never used  Substance Use Topics  . Alcohol use: No    Alcohol/week: 0.0 standard drinks  . Drug use: No    Home Medications Prior to Admission medications   Medication Sig Start Date End Date Taking? Authorizing Provider  Cholecalciferol (VITAMIN D) 2000 units tablet Take 5,000 Units by mouth daily.    Yes [provider]  Cholecalciferol (VITAMIN D3) 1.25 MG (50000 UT) CAPS Take 1 capsule by mouth once a week. 06/08/20  Yes [provider]  Cyanocobalamin 1000 MCG/ML KIT Inject 1 mL as directed See admin instructions. Inject 1cc into skin every other week.   Yes [provider]  levothyroxine (SYNTHROID, LEVOTHROID) 112 MCG tablet TAKE 1 TABLET(112 MCG) BY MOUTH DAILY BEFORE BREAKFAST 12/19/17  Yes Fontaine, Belinda Block, MD  simethicone (MYLICON) 379 MG chewable tablet Chew 125-250 mg by mouth every 6 (six) hours as needed for flatulence.   Yes [provider]  omeprazole-sodium bicarbonate (ZEGERID) 40-1100 MG capsule Take 1 capsule by mouth daily before breakfast. Patient not taking: Reported on 06/22/2020 01/14/16   Mahala Menghini, PA-C    Allergies      Latex, Omeprazole, Shrimp [shellfish allergy], Betadine [povidone iodine], and Eggs or egg-derived products  Review of Systems   Review of Systems  Constitutional: Negative for diaphoresis and fever.  Respiratory: Negative for chest tightness and shortness of breath.   Cardiovascular: Negative for chest pain, palpitations and leg swelling.  Gastrointestinal: Positive for nausea. Negative for vomiting.  Musculoskeletal: Positive for myalgias.  Skin: Negative for rash and wound.  Allergic/Immunologic: Negative for immunocompromised state.  Neurological: Positive for headaches. Negative for weakness and numbness.  All other systems reviewed and are negative.   Physical Exam Updated Vital Signs BP (!) 117/93   Pulse 71   Temp 98 F (36.7 C) (Oral)   Resp (!) 22   Ht $R'5\' 6"'Gy$  (1.676 m)   Wt 108.9 kg   LMP 02/18/2011   SpO2 98%   BMI 38.74 kg/m   Physical Exam Vitals and nursing note reviewed.  Constitutional:  General: She is not in acute distress.    Appearance: She is well-developed. She is not diaphoretic.  HENT:     Head: Normocephalic and atraumatic.  Cardiovascular:     Rate and Rhythm: Normal rate and regular rhythm.     Heart sounds: Normal heart sounds. No murmur heard.   Pulmonary:     Effort: Pulmonary effort is normal.     Breath sounds: Normal breath sounds.  Chest:     Chest wall: No tenderness.  Musculoskeletal:     Cervical back: Normal range of motion and neck supple.     Right lower leg: No tenderness. No edema.     Left lower leg: No tenderness. No edema.  Skin:    General: Skin is warm and dry.     Findings: No erythema or rash.  Neurological:     Mental Status: She is alert and oriented to person, place, and time.  Psychiatric:        Behavior: Behavior normal.     ED Results / Procedures / Treatments   Labs (all labs ordered are listed, but only abnormal results are displayed) Labs Reviewed  BASIC METABOLIC PANEL  CBC  TROPONIN I  (HIGH SENSITIVITY)  TROPONIN I (HIGH SENSITIVITY)    EKG EKG Interpretation  Date/Time:  Monday June 22 2020 11:55:50 EDT Ventricular Rate:  72 PR Interval:  156 QRS Duration: 90 QT Interval:  428 QTC Calculation: 468 R Axis:   3 Text Interpretation: Normal sinus rhythm Low voltage QRS Borderline ECG No significant change since prior 10/18 Confirmed by Aletta Edouard [phone]) on 06/22/2020 2:58:48 PM   Radiology DG Chest 2 View  Result Date: 06/22/2020 CLINICAL DATA:  Onset chest pain last night. EXAM: CHEST - 2 VIEW COMPARISON:  PA and lateral chest 05/12/2016. FINDINGS: Lungs are clear. Heart size is normal. No pneumothorax or pleural fluid. No acute or focal bony abnormality. IMPRESSION: Negative chest. Electronically Signed   By: Inge Rise M.D.   On: 06/22/2020 12:55    Procedures Procedures (including critical care time)  Medications Ordered in ED Medications - No data to display  ED Course  I have reviewed the triage vital signs and the nursing notes.  Pertinent labs & imaging results that were available during my care of the patient were reviewed by me and considered in my medical decision making (see chart for details).  Clinical Course as of Jun 22 1818  Mon Jun 22, 5256  1933 64 year old female here with left arm tightness radiating up into her left neck.  Had a B12 shot yesterday but she has had multiple of those and never caused her this problem before.    [MB]  1819 Troponin x2 -, EKG without acute ischemic changes, CBC and BMP within normal notes.  Chest x-ray unremarkable.  Discussed results with patient, recommend return to ER for worsening or concerning symptoms otherwise follow-up with PCP for recheck.   [LM]    Clinical Course User Index [LM] Tacy Learn, PA-C [MB] Hayden Rasmussen, MD   MDM Rules/Calculators/A&P HEAR Score: 2                        Final Clinical Impression(s) / ED Diagnoses Final diagnoses:  Acute pain of left  shoulder    Rx / DC Orders ED Discharge Orders    None       Roque Lias 06/22/20 1819    Hayden Rasmussen, MD  06/23/20 1039  

## 2021-02-05 ENCOUNTER — Other Ambulatory Visit (HOSPITAL_COMMUNITY): Payer: Self-pay | Admitting: Internal Medicine

## 2021-02-05 DIAGNOSIS — Z1231 Encounter for screening mammogram for malignant neoplasm of breast: Secondary | ICD-10-CM

## 2021-02-22 ENCOUNTER — Ambulatory Visit (HOSPITAL_COMMUNITY)
Admission: RE | Admit: 2021-02-22 | Discharge: 2021-02-22 | Disposition: A | Discharge status: Home / Self Care | Payer: 59 | Source: Ambulatory Visit | Attending: Internal Medicine | Admitting: Internal Medicine

## 2021-02-22 DIAGNOSIS — Z1231 Encounter for screening mammogram for malignant neoplasm of breast: Secondary | ICD-10-CM | POA: Diagnosis not present

## 2021-06-08 ENCOUNTER — Other Ambulatory Visit (HOSPITAL_COMMUNITY): Payer: Self-pay | Admitting: Adult Health Nurse Practitioner

## 2021-06-08 ENCOUNTER — Other Ambulatory Visit: Payer: Self-pay | Admitting: Adult Health Nurse Practitioner

## 2021-06-16 ENCOUNTER — Other Ambulatory Visit (HOSPITAL_COMMUNITY): Payer: Self-pay | Admitting: Adult Health Nurse Practitioner

## 2021-06-16 ENCOUNTER — Other Ambulatory Visit: Payer: Self-pay | Admitting: Adult Health Nurse Practitioner

## 2021-06-16 DIAGNOSIS — R9389 Abnormal findings on diagnostic imaging of other specified body structures: Secondary | ICD-10-CM

## 2021-07-30 ENCOUNTER — Ambulatory Visit (HOSPITAL_COMMUNITY)
Admission: RE | Admit: 2021-07-30 | Discharge: 2021-07-30 | Disposition: A | Discharge status: Home / Self Care | Payer: 59 | Source: Ambulatory Visit | Attending: Adult Health Nurse Practitioner | Admitting: Adult Health Nurse Practitioner

## 2021-07-30 ENCOUNTER — Other Ambulatory Visit: Payer: Self-pay

## 2021-07-30 DIAGNOSIS — R9389 Abnormal findings on diagnostic imaging of other specified body structures: Secondary | ICD-10-CM | POA: Diagnosis present

## 2021-12-21 ENCOUNTER — Other Ambulatory Visit (HOSPITAL_COMMUNITY): Payer: Self-pay | Admitting: Adult Health Nurse Practitioner

## 2021-12-21 DIAGNOSIS — E559 Vitamin D deficiency, unspecified: Secondary | ICD-10-CM

## 2021-12-27 ENCOUNTER — Ambulatory Visit (HOSPITAL_COMMUNITY)
Admission: RE | Admit: 2021-12-27 | Discharge: 2021-12-27 | Disposition: A | Discharge status: Home / Self Care | Payer: 59 | Source: Ambulatory Visit | Attending: Adult Health Nurse Practitioner | Admitting: Adult Health Nurse Practitioner

## 2021-12-27 DIAGNOSIS — E559 Vitamin D deficiency, unspecified: Secondary | ICD-10-CM | POA: Insufficient documentation

## 2022-01-18 ENCOUNTER — Encounter: Payer: Self-pay | Admitting: *Deleted

## 2022-01-19 ENCOUNTER — Ambulatory Visit (INDEPENDENT_AMBULATORY_CARE_PROVIDER_SITE_OTHER): Payer: 59 | Admitting: Cardiology

## 2022-01-19 ENCOUNTER — Encounter: Payer: Self-pay | Admitting: Cardiology

## 2022-01-19 VITALS — BP 116/70 | HR 71 | Ht 64.0 in | Wt 237.6 lb

## 2022-01-19 DIAGNOSIS — Z87898 Personal history of other specified conditions: Secondary | ICD-10-CM | POA: Diagnosis not present

## 2022-01-19 DIAGNOSIS — R7303 Prediabetes: Secondary | ICD-10-CM

## 2022-01-19 DIAGNOSIS — Z8249 Family history of ischemic heart disease and other diseases of the circulatory system: Secondary | ICD-10-CM

## 2022-01-19 DIAGNOSIS — R079 Chest pain, unspecified: Secondary | ICD-10-CM | POA: Diagnosis not present

## 2022-01-19 DIAGNOSIS — E782 Mixed hyperlipidemia: Secondary | ICD-10-CM

## 2022-01-19 MED ORDER — METOPROLOL TARTRATE 100 MG PO TABS: 100.0000 mg | ORAL_TABLET | Freq: Once | ORAL | 0 refills | Status: DC

## 2022-01-19 NOTE — Patient Instructions (Addendum)
Medication Instructions:  ?Your physician recommends that you continue on your current medications as directed. Please refer to the Current Medication list given to you today. ? ?Labwork: ?none ? ?Testing/Procedures: ?Cardiac CTA ? ?Follow-Up: ?Your physician recommends that you schedule a follow-up appointment in: pending ? ?Any Other Special Instructions Will Be Listed Below (If Applicable). ? ?If you need a refill on your cardiac medications before your next appointment, please call your pharmacy. ? ? ? ?Your cardiac CT will be scheduled at one of the below locations:  ? ?[facility] ?[address] ?[place], [place] [address] ?[phone] ? ?If scheduled at [facility], please arrive at the [facility] and Children's Entrance (Entrance C2) of [facility] 30 minutes prior to test start time. ?You can use the FREE valet parking offered at entrance C (encouraged to control the heart rate for the test)  ?Proceed to the [facility] Radiology Department (first floor) to check-in and test prep. ? ?All radiology patients and guests should use entrance C2 at [facility], accessed from [facility], even though the hospital's physical address listed is [address]. ? ? ? ?Please follow these instructions carefully (unless otherwise directed): ? ?On the Night Before the Test: ?Be sure to Drink plenty of water. ?Do not consume any caffeinated/decaffeinated beverages or chocolate 12 hours prior to your test. ?Do not take any antihistamines 12 hours prior to your test. ? ?On the Day of the Test: ?Drink plenty of water until 1 hour prior to the test. ?Do not eat any food 4 hours prior to the test. ?You may take your regular medications prior to the test.  ?Take metoprolol 100 mg (Lopressor) two hours prior to test. ?FEMALES- please wear underwire-free bra if available, avoid dresses & tight clothing ?     ?After the Test: ?Drink plenty of water. ?After receiving IV  contrast, you may experience a mild flushed feeling. This is normal. ?On occasion, you may experience a mild rash up to 24 hours after the test. This is not dangerous. If this occurs, you can take Benadryl 25 mg and increase your fluid intake. ?If you experience trouble breathing, this can be serious. If it is severe call 911 IMMEDIATELY. If it is mild, please call our office. ? ?We will call to schedule your test 2-4 weeks out understanding that some insurance companies will need an authorization prior to the service being performed.  ? ?For non-scheduling related questions, please contact the cardiac imaging nurse navigator should you have any questions/concerns: ?Marchia Bond, Cardiac Imaging Nurse Navigator ?Gordy Clement, Cardiac Imaging Nurse Navigator ?[facility] ?Direct Office Dial: [phone]  ? ?For scheduling needs, including cancellations and rescheduling, please call [place], [phone]. ?

## 2022-01-19 NOTE — Progress Notes (Signed)
? ? ?Cardiology Office Note ? ?Date: 01/19/2022  ? ?ID: Rachel Branch, DOB 1956/08/20, MRN [id number] ? ?PCP:  Pablo Lawrence, NP  ?Cardiologist:  Rozann Lesches, MD ?Electrophysiologist:  None  ? ?Chief Complaint  ?Patient presents with  ? Chest discomfort  ? ? ?History of Present Illness: ?Rachel Branch is a 66 y.o. female referred for cardiology consultation by Ms. Kari Baars NP for the evaluation of chest discomfort.  She is here today with her daughter, a Marine scientist at [employer].  She describes a history of recurring, shocklike, left-sided chest discomfort.  This has been going on over the last few months, no definite precipitant.  Unrelated to this she also feels a vague and brief fluttering in her chest usually when she is still at nighttime.  She has had no sudden, unexplained syncope.  No prior documented history of ischemic heart disease or cardiomyopathy. ? ?She does have history of heart disease in both parents, although not technically premature.  Also personal history of prediabetes, pernicious anemia, hypothyroidism, and at this point diet managed hyperlipidemia.  Recent lab work as noted below.  Her LDL was 147, hemoglobin A1c 5.8%. ? ?She has not undergone any prior ischemic evaluation. ? ?Past Medical History:  ?Diagnosis Date  ? GERD (gastroesophageal reflux disease)   ? Hypothyroidism   ? IBS (irritable bowel syndrome)   ? Mixed hyperlipidemia   ? Pernicious anemia   ? B12 injections every other week  ? Prediabetes   ? ? ?Past Surgical History:  ?Procedure Laterality Date  ? ABDOMINAL HYSTERECTOMY    ? CHOLECYSTECTOMY N/A 10/24/2016  ? Procedure: LAPAROSCOPIC CHOLECYSTECTOMY;  Surgeon: Aviva Signs, MD;  [facility]: [facility];  Service: General;  Laterality: N/A;  ? COLONOSCOPY WITH PROPOFOL N/A 07/18/2017  ? Procedure: COLONOSCOPY WITH PROPOFOL;  Surgeon: Danie Binder, MD;  [facility];  Service: Endoscopy;  Laterality: N/A;  8:15am  ? COMBINED HYSTEROSCOPY DIAGNOSTIC / D&C  03/14/11  ? benign  endometrial polyps  ? CYSTOSCOPY N/A 07/28/2015  ? Procedure: CYSTOSCOPY;  Surgeon: Anastasio Auerbach, MD;  Location: [facility];  Service: Gynecology;  Laterality: N/A;  ? DILATATION & CURETTAGE/HYSTEROSCOPY WITH MYOSURE N/A 12/02/2014  ? Procedure: [place];  Surgeon: Anastasio Auerbach, MD;  Location: [facility];  Service: Gynecology;  Laterality: N/A;  ? DILATATION & CURETTAGE/HYSTEROSCOPY WITH TRUECLEAR N/A 09/18/2013  ? Procedure: DILATATION & CURETTAGE/HYSTEROSCOPY WITH TRUCLEAR;  Surgeon: Anastasio Auerbach, MD;  Location: [facility];  Service: Gynecology;  Laterality: N/A;  ? [place] OF UTERUS  2007  ? LAPAROSCOPIC ASSISTED VAGINAL HYSTERECTOMY N/A 07/28/2015  ? Procedure: LAPAROSCOPIC ASSISTED VAGINAL HYSTERECTOMY;  Surgeon: Anastasio Auerbach, MD;  Location: [facility];  Service: Gynecology;  Laterality: N/A;  ? LAPAROSCOPIC BILATERAL SALPINGO OOPHERECTOMY Bilateral 07/28/2015  ? Procedure: LAPAROSCOPIC BILATERAL SALPINGO OOPHORECTOMY;  Surgeon: Anastasio Auerbach, MD;  Location: [facility];  Service: Gynecology;  Laterality: Bilateral;  ? LAPAROSCOPIC LYSIS OF ADHESIONS N/A 07/28/2015  ? Procedure: LAPAROSCOPIC LYSIS OF ADHESIONS;  Surgeon: Anastasio Auerbach, MD;  Location: [facility];  Service: Gynecology;  Laterality: N/A;  ? LIVER BIOPSY N/A 10/24/2016  ? Procedure: LIVER BIOPSY;  Surgeon: Aviva Signs, MD;  [facility];  Service: General;  Laterality: N/A;  ? [place]  ? ? ?Current Outpatient Medications  ?Medication Sig Dispense Refill  ? Cholecalciferol (VITAMIN D) 2000 units tablet Take 2,000 Units by mouth daily.    ? Cyanocobalamin 1000 MCG/ML KIT  Inject 1 mL as directed See admin instructions. Inject 1cc into skin every other week.    ? levothyroxine (SYNTHROID, LEVOTHROID) 112 MCG tablet TAKE 1 TABLET(112 MCG) BY MOUTH DAILY BEFORE BREAKFAST 30 tablet 0  ? simethicone (MYLICON) 500 MG chewable tablet Chew 125-250 mg by mouth every 6 (six) hours as  needed for flatulence.    ? ?No current facility-administered medications for this visit.  ? ?Allergies:  Latex, Omeprazole, Shrimp [shellfish allergy], Betadine [povidone iodine], and Eggs or egg-derived products  ? ?Social History: The patient  reports that she has never smoked. She has never used smokeless tobacco. She reports that she does not drink alcohol and does not use drugs.  ? ?Family History: The patient's family history includes Cancer in her brother; Diabetes in her brother and mother; Heart disease in her mother; Hypertension in her father and mother.  ? ?ROS: Occasional ankle edema, left greater than right.  Chronic left knee pain. ? ?Physical Exam: ?VS:  BP 116/70   Pulse 71   Ht 5' 4"  (1.626 m)   Wt 237 lb 9.6 oz (107.8 kg)   LMP 02/18/2011   SpO2 96%   BMI 40.78 kg/m? , BMI Body mass index is 40.78 kg/m?. ? ?Wt Readings from Last 3 Encounters:  ?01/19/22 237 lb 9.6 oz (107.8 kg)  ?06/22/20 240 lb (108.9 kg)  ?01/04/18 240 lb (108.9 kg)  ?  ?General: Patient appears comfortable at rest. ?HEENT: Conjunctiva and lids normal. ?Neck: Supple, no elevated JVP or carotid bruits, no thyromegaly. ?Lungs: Clear to auscultation, nonlabored breathing at rest. ?Cardiac: Regular rate and rhythm, no S3 or significant systolic murmur, no pericardial rub. ?Abdomen: Soft, nontender, bowel sounds present. ?Extremities: Trace ankle edema, distal pulses 2+. ?Skin: Warm and dry. ?Musculoskeletal: No kyphosis. ?Neuropsychiatric: Alert and oriented x3, affect grossly appropriate. ? ?ECG:  An ECG dated 06/22/2020 was personally reviewed today and demonstrated:  Sinus rhythm with nonspecific T wave changes. ? ?Recent Labwork: ? ?March 2023: Hemoglobin A1c 5.8%, high-sensitivity troponin T normal, hemoglobin 13.2, platelets 337, BUN 12, creatinine 0.81, potassium 4.5, AST 36, ALT 52, cholesterol 232, triglycerides 96, HDL 68, LDL 147, TSH 0.655 ? ?Other Studies Reviewed Today: ? ?No prior cardiac testing for review  today. ? ?Assessment and Plan: ? ?1.  Recurrent atypical chest pain in a 66 year old woman with BMI of 40, prediabetes, diet managed hyperlipidemia, and family history of heart disease although not technically premature.  Recent LDL 147.  We have discussed further cardiac diagnostic options and will plan to proceed with a coronary CTA.  This will give [place] assessment of her coronary anatomy and also a calcium score, also useful in determining how aggressive she should be with her lipids.  She has been hesitant to take therapy to lower her cholesterol, but statin may well be indicated.  Further plans to follow. ? ?2.  Hypothyroidism, on Synthroid.  Recent TSH 0.655.  She continues to follow with PCP. ? ?3.  Pernicious anemia on chronic B12 supplementation. ? ?4.  Prediabetes, recent hemoglobin A1c 5.8%. ? ?Medication Adjustments/Labs and Tests Ordered: ?Current medicines are reviewed at length with the patient today.  Concerns regarding medicines are outlined above.  ? ?Tests Ordered: ?Orders Placed This Encounter  ?Procedures  ? CT CORONARY MORPH W/CTA COR W/SCORE W/CA W/CM &/OR WO/CM  ? ? ?Medication Changes: ?No orders of the defined types were placed in this encounter. ? ? ?Disposition:  Follow up  test results. ? ?Signed, ?Satira Sark, MD, [facility] ?  01/19/2022 1:43 PM    ?[place] [facility] ?[address], [place], [place] [address] ?Phone: [phone]; Fax: [phone]  ?

## 2022-01-20 ENCOUNTER — Other Ambulatory Visit: Payer: Self-pay | Admitting: Cardiology

## 2022-02-04 ENCOUNTER — Telehealth (HOSPITAL_COMMUNITY): Payer: Self-pay | Admitting: Emergency Medicine

## 2022-02-04 NOTE — Telephone Encounter (Signed)
Attempted to call patient regarding upcoming cardiac CT appointment. °Left message on voicemail with name and callback number °Micajah Dennin RN Navigator Cardiac Imaging °[facility] °[phone] Office °[phone] Cell ° °

## 2022-02-04 NOTE — Telephone Encounter (Signed)
Reaching out to patient to offer assistance regarding upcoming cardiac imaging study; pt verbalizes understanding of appt date/time, parking situation and where to check in, pre-test NPO status and medications ordered, and verified current allergies; name and call back number provided for further questions should they arise Marchia Bond RN Navigator Cardiac Imaging [facility] [phone] office [phone] cell  Difficult IV '100mg'$  metoprolol tartrate Arrival 830

## 2022-02-08 ENCOUNTER — Ambulatory Visit (HOSPITAL_COMMUNITY)
Admission: RE | Admit: 2022-02-08 | Discharge: 2022-02-08 | Disposition: A | Discharge status: Home / Self Care | Payer: 59 | Source: Ambulatory Visit | Attending: Cardiology | Admitting: Cardiology

## 2022-02-08 DIAGNOSIS — R079 Chest pain, unspecified: Secondary | ICD-10-CM

## 2022-02-08 MED ORDER — NITROGLYCERIN 0.4 MG SL SUBL
SUBLINGUAL_TABLET | SUBLINGUAL | Status: AC
  Filled 2022-02-08: qty 2

## 2022-02-08 MED ORDER — IOHEXOL 350 MG/ML SOLN
95.0000 mL | Freq: Once | INTRAVENOUS | Status: AC | PRN
  Administered 2022-02-08: 95 mL via INTRAVENOUS

## 2022-02-08 MED ORDER — NITROGLYCERIN 0.4 MG SL SUBL
0.8000 mg | SUBLINGUAL_TABLET | Freq: Once | SUBLINGUAL | Status: AC
  Administered 2022-02-08: 0.8 mg via SUBLINGUAL

## 2022-03-25 ENCOUNTER — Other Ambulatory Visit (HOSPITAL_COMMUNITY): Payer: Self-pay | Admitting: Adult Health Nurse Practitioner

## 2022-03-25 DIAGNOSIS — Z1231 Encounter for screening mammogram for malignant neoplasm of breast: Secondary | ICD-10-CM

## 2022-04-06 ENCOUNTER — Ambulatory Visit (HOSPITAL_COMMUNITY)
Admission: RE | Admit: 2022-04-06 | Discharge: 2022-04-06 | Disposition: A | Discharge status: Home / Self Care | Payer: 59 | Source: Ambulatory Visit | Attending: Adult Health Nurse Practitioner | Admitting: Adult Health Nurse Practitioner

## 2022-04-06 DIAGNOSIS — Z1231 Encounter for screening mammogram for malignant neoplasm of breast: Secondary | ICD-10-CM | POA: Insufficient documentation

## 2022-11-15 ENCOUNTER — Encounter: Payer: Self-pay | Admitting: Neurology

## 2022-11-16 NOTE — Progress Notes (Unsigned)
Initial neurology clinic note  SERVICE DATE: 11/17/22  Reason for Evaluation: Consultation requested by Rachel Lawrence, NP for an opinion regarding numbness and tingling in arms and legs. My final recommendations will be communicated back to the requesting physician by way of shared medical record or letter to requesting physician via [place] mail.  HPI: This is [name], a 67 y.o. right-handed female with a medical history of pernicious anemia, iron deficiency anemia, vit D deficiency, hypothyroidism who presents to neurology clinic with the chief complaint of numbness and tingling in arms and legs. The patient is accompanied by husband.  Patient has had pernicious anemia since she was in her 88s. In the past, she would have numbness and tingling and imbalance. She was tested by neurology and her B12 was found to be low. She started B12 injections and her symptoms resolved.   If patient does not give herself her B12 shot, she will start noticing symptoms. Her B12 has not be low recently.  She has had tingling in hands and feet since 08/2022 even though she was taking B12. It is her fingers (all) and middle of feet to toes. She even increased B12 but this did not help (maybe better). The feet are equal. Her right hand is worse than the left though. She also mentions that she now takes steps one at a time because she feels it is hard to raise her legs high enough.  Patient has occasional neck pain, but it does not radiate into her arms. She denies back pain. She has occasional leg cramps.  She does not report any constitutional symptoms like fever, night sweats, anorexia or unintentional weight loss.  EtOH use: None  Restrictive diet? No Family history of neuropathy/myopathy/neurology disease? Mother had diabetic neuropathy  Patient had an EMG about 40 years ago (when diagnosed with B12 problems).   MEDICATIONS:  Outpatient Encounter Medications as of 11/17/2022  Medication Sig    Cholecalciferol (VITAMIN D) 2000 units tablet Take 2,000 Units by mouth daily.   Cyanocobalamin 1000 MCG/ML KIT Inject 1 mL as directed See admin instructions. Inject 1cc into skin every other week.   levothyroxine (SYNTHROID, LEVOTHROID) 112 MCG tablet TAKE 1 TABLET(112 MCG) BY MOUTH DAILY BEFORE BREAKFAST   simethicone (MYLICON) 0000000 MG chewable tablet Chew 125-250 mg by mouth every 6 (six) hours as needed for flatulence.   vitamin C (ASCORBIC ACID) 250 MG tablet Take 250 mg by mouth daily. 2 chewy a day   metoprolol tartrate (LOPRESSOR) 100 MG tablet Take 1 tablet (100 mg total) by mouth once for 1 dose. 2 hours before your cardiac cta   No facility-administered encounter medications on file as of 11/17/2022.    PAST MEDICAL HISTORY: Past Medical History:  Diagnosis Date   GERD (gastroesophageal reflux disease)    Hypothyroidism    IBS (irritable bowel syndrome)    Mixed hyperlipidemia    Pernicious anemia    B12 injections every other week   Prediabetes     PAST SURGICAL HISTORY: Past Surgical History:  Procedure Laterality Date   ABDOMINAL HYSTERECTOMY     CHOLECYSTECTOMY N/A 10/24/2016   Procedure: LAPAROSCOPIC CHOLECYSTECTOMY;  Surgeon: Rachel Signs, MD;  Location: [facility];  Service: General;  Laterality: N/A;   COLONOSCOPY WITH PROPOFOL N/A 07/18/2017   Procedure: COLONOSCOPY WITH PROPOFOL;  Surgeon: Rachel Binder, MD;  [facility];  Service: Endoscopy;  Laterality: N/A;  8:15am   COMBINED HYSTEROSCOPY DIAGNOSTIC / D&C  03/14/11  benign endometrial polyps   CYSTOSCOPY N/A 07/28/2015   Procedure: CYSTOSCOPY;  Surgeon: Rachel Auerbach, MD;  Location: [facility];  Service: Gynecology;  Laterality: N/A;   DILATATION & CURETTAGE/HYSTEROSCOPY WITH MYOSURE N/A 12/02/2014   Procedure: DILATATION & CURETTAGE/HYSTEROSCOPY WITH MYOSURE;  Surgeon: Rachel Auerbach, MD;  Location: [facility];  Service: Gynecology;  Laterality: N/A;   DILATATION & CURETTAGE/HYSTEROSCOPY WITH  TRUECLEAR N/A 09/18/2013   Procedure: DILATATION & CURETTAGE/HYSTEROSCOPY WITH TRUCLEAR;  Surgeon: Rachel Auerbach, MD;  Location: [facility];  Service: Gynecology;  Laterality: N/A;   DILATION AND CURETTAGE OF UTERUS  2007   LAPAROSCOPIC ASSISTED VAGINAL HYSTERECTOMY N/A 07/28/2015   Procedure: LAPAROSCOPIC ASSISTED VAGINAL HYSTERECTOMY;  Surgeon: Rachel Auerbach, MD;  Location: [facility];  Service: Gynecology;  Laterality: N/A;   LAPAROSCOPIC BILATERAL SALPINGO OOPHERECTOMY Bilateral 07/28/2015   Procedure: LAPAROSCOPIC BILATERAL SALPINGO OOPHORECTOMY;  Surgeon: Rachel Auerbach, MD;  Location: [facility];  Service: Gynecology;  Laterality: Bilateral;   LAPAROSCOPIC LYSIS OF ADHESIONS N/A 07/28/2015   Procedure: LAPAROSCOPIC LYSIS OF ADHESIONS;  Surgeon: Rachel Auerbach, MD;  Location: [facility];  Service: Gynecology;  Laterality: N/A;   LIVER BIOPSY N/A 10/24/2016   Procedure: LIVER BIOPSY;  Surgeon: Rachel Signs, MD;  Location: [facility];  Service: General;  Laterality: N/A;   TUBAL LIGATION  1997    ALLERGIES: Allergies  Allergen Reactions   Latex Rash    Mouth ulcers after dental work with Latex gloves.   Omeprazole     RASH TO GENERIC BRAND   Shrimp [Shellfish Allergy] Swelling   Betadine [Povidone Iodine] Itching and Rash   Eggs Or Egg-Derived Products Hives    FAMILY HISTORY: Family History  Problem Relation Age of Onset   Diabetes Mother    Hypertension Mother    Heart disease Mother    Hypertension Father    Diabetes Brother    Cancer Brother        prostate   Colon cancer Neg Hx     SOCIAL HISTORY: Social History   Tobacco Use   Smoking status: Never   Smokeless tobacco: Never   Tobacco comments:    Never smoked  Vaping Use   Vaping Use: Never used  Substance Use Topics   Alcohol use: No    Alcohol/week: 0.0 standard drinks of alcohol   Drug use: No   Social History   Social History Narrative   Are you right handed or left handed? Right   Are you currently  employed ? no   What is your current occupation? retired   Do you live at home alone?   Who lives with you? Husband   What type of home do you live in: 1 story or 2 story? two    Caffeine 2-4 a day     OBJECTIVE: PHYSICAL EXAM: BP (!) 149/76   Pulse 72   Ht '5\' 5"'$  (1.651 m)   Wt 240 lb (108.9 kg)   LMP 02/18/2011   SpO2 96%   BMI 39.94 kg/m   General: General appearance: Awake and alert. No distress. Cooperative with exam.  Skin: No obvious rash or jaundice. HEENT: Atraumatic. Anicteric. Lungs: Non-labored breathing on room air  Extremities: No obvious deformity.  Psych: Affect appropriate.  Neurological: Mental Status: Alert. Speech fluent. No pseudobulbar affect Cranial Nerves: CNII: No RAPD. Visual fields grossly intact. CNIII, IV, VI: PERRL. No nystagmus. EOMI. CN V: Facial sensation intact bilaterally to fine touch. CN VII: Facial muscles symmetric and strong. No  ptosis at rest. CN VIII: Hearing grossly intact bilaterally. CN IX: No hypophonia. CN X: Palate elevates symmetrically. CN XI: Full strength shoulder shrug bilaterally. CN XII: Tongue protrusion full and midline. No atrophy or fasciculations. No significant dysarthria Motor: Tone is normal. No atrophy.  Individual muscle group testing (MRC grade out of 5):  Movement     Neck flexion 5    Neck extension 5     Right Left   Shoulder abduction 5 5   Shoulder adduction 5 5   Shoulder ext rotation 5 5   Shoulder int rotation 5 5   Elbow flexion 5 5   Elbow extension 5 5   Wrist extension 5 5   Wrist flexion 5 5   Finger abduction - FDI 5 5   Finger abduction - ADM 5 5   Finger extension 5 5   Finger distal flexion - 2/'3 5 5   '$ Finger distal flexion - 4/'5 5 5   '$ Thumb flexion - FPL 5 5   Thumb abduction - APB 5- 5    Hip flexion 5 5   Hip extension 5 5   Hip adduction 5 5   Hip abduction 5 5   Knee extension 5 5   Knee flexion 5 5   Dorsiflexion 5 5   Plantarflexion 5 5   Inversion 5 5    Eversion 5 5   Great toe extension 5 5   Great toe flexion 5 5     Reflexes:  Right Left   Bicep 2+ 2+   Tricep 2+ 2+   BrRad 2+ 2+   Knee 1+ 1+   Ankle Trace Trace    Pathological Reflexes: Babinski: flexor response bilaterally Hoffman: absent bilaterally Troemner: absent bilaterally Sensation: Pinprick: Diminished to lateral aspect of bilateral forearms, otherwise intact Vibration: Not able to tolerate well on feet, but may be reduced in bilateral feet Proprioception: Intact in bilateral great toes. Phalen and tinel's test negative bilaterally Coordination: Intact finger-to- nose-finger bilaterally. Romberg negative. Gait: Able to rise from chair with arms crossed unassisted. Normal, narrow-based gait. Able to tandem walk. Able to walk on toes and heels.  Lab and Test Review: External labs: 09/06/22: B12: 996 Folate: 19.5 Vit D 31.5 CMP: significant for glucose of 114, alk phos 166, ALT 43 ANA: negative CBC wnl  HbA1c (12/14/21): 5.8  Imaging: Lumbar spine xray (07/11/13): FINDINGS:  There is no evidence of lumbar spine fracture. Alignment is normal.  Mild osteophyte formation is noted at L2-3 and L3-4 consistent with  minimal degenerative disc disease at these levels.   IMPRESSION:  Minimal degenerative disc disease is noted at L2-3 and L3-4. No  acute abnormality seen in the lumbar spine.   ASSESSMENT: Rachel Branch is a 67 y.o. female who presents for evaluation of numbness and tingling in bilateral feet and hands. She has a relevant medical history of pernicious anemia, iron deficiency anemia, vit D deficiency, hypothyroidism. Her neurological examination shows no definitive abnormalities. Available diagnostic data is significant for B12 in 08/2022 of 996 and HbA1c in 11/2021 of 5.8. The etiology of patient's symptoms is currently unclear. Although she carries a diagnosis of pernicious anemia which was clearly associated with symptoms in the past, the history  is odd that missing a shot of B12 here and there over 40 years might cause symptoms. Also, her B12 has been normal of late though MMA and homocysteine have not been checked that I can see. I will do blood  work today and get EMG to further evaluate.  PLAN: -Blood work: HbA1c, B1, IFE, MMA, homocysteine -EMG: PN (R > L) -Discussed medication, patient declined -Lidocaine PRN discussed  -Return to clinic in 3 months  The impression above as well as the plan as outlined below were extensively discussed with the patient (in the company of husband) who voiced understanding. All questions were answered to their satisfaction.  When available, results of the above investigations and possible further recommendations will be communicated to the patient via telephone/MyChart. Patient to call office if not contacted after expected testing turnaround time.   Total time spent reviewing records, interview, history/exam, documentation, and coordination of care on day of encounter:  55 min   Thank you for allowing me to participate in patient's care.  If I can answer any additional questions, I would be pleased to do so.  Kai Levins, MD   CC: Rachel Lawrence, NP [place] [address]  CC: Referring provider: Pablo Lawrence, NP [address] [place],  [place] [address]

## 2022-11-16 NOTE — Progress Notes (Deleted)
Initial neurology clinic note  SERVICE DATE: 11/17/22  Reason for Evaluation: Consultation requested by Pablo Lawrence, NP for an opinion regarding numbness and tingling in legs and hands. My final recommendations will be communicated back to the requesting physician by way of shared medical record or letter to requesting physician via [place] mail.  HPI: This is [name], a 67 y.o. ***-handed female with a medical history of pre-diabetes, HLD, hypothyroidism, GERD, fatty liver, vit D deficiency*** who presents to neurology clinic with the chief complaint of ***. The patient is accompanied by ***.  ***  The patient has not*** had similar episodes of symptoms in the past. ***  Muscle bulk loss? *** Muscle pain? ***  Cramps/Twitching? *** Suggestion of myotonia/difficulty relaxing after contraction? ***  Fatigable weakness?*** Does strength improve after brief exercise?***  Able to brush hair/teeth without difficulty? *** Able to button shirts/use zips? *** Clumsiness/dropping grasped objects?*** Can you arise from squatted position easily? *** Able to get out of chair without using arms? *** Able to walk up steps easily? *** Use an assistive device to walk? *** Significant imbalance with walking? *** Falls?*** Any change in urine color, especially after exertion/physical activity? ***  The patient denies*** symptoms suggestive of oculobulbar weakness including diplopia, ptosis, dysphagia, poor saliva control, dysarthria/dysphonia, impaired mastication, facial weakness/droop.  There are no*** neuromuscular respiratory weakness symptoms, particularly orthopnea>dyspnea.   Pseudobulbar affect is absent***.  The patient does not*** report symptoms referable to autonomic dysfunction including impaired sweating, heat or cold intolerance, excessive mucosal dryness, gastroparetic early satiety, postprandial abdominal bloating, constipation, bowel or bladder dyscontrol, erectile  dysfunction*** or syncope/presyncope/orthostatic intolerance.  There are no*** complaints relating to other symptoms of small fiber modalities including paresthesia/pain.  The patient has not *** noticed any recent skin rashes nor does he*** report any constitutional symptoms like fever, night sweats, anorexia or unintentional weight loss.  EtOH use: ***  Restrictive diet? *** Family history of neuropathy/myopathy/NM disease?***  Previous labs, electrodiagnostics, and neuroimaging are summarized below, but pertinent findings include***  Any biopsy done? *** Current medications being tried for the patient's symptoms include ***  Prior medications that have been tried: ***   MEDICATIONS:  Outpatient Encounter Medications as of 11/17/2022  Medication Sig   Cholecalciferol (VITAMIN D) 2000 units tablet Take 2,000 Units by mouth daily.   Cyanocobalamin 1000 MCG/ML KIT Inject 1 mL as directed See admin instructions. Inject 1cc into skin every other week.   levothyroxine (SYNTHROID, LEVOTHROID) 112 MCG tablet TAKE 1 TABLET(112 MCG) BY MOUTH DAILY BEFORE BREAKFAST   metoprolol tartrate (LOPRESSOR) 100 MG tablet Take 1 tablet (100 mg total) by mouth once for 1 dose. 2 hours before your cardiac cta   simethicone (MYLICON) 0000000 MG chewable tablet Chew 125-250 mg by mouth every 6 (six) hours as needed for flatulence.   No facility-administered encounter medications on file as of 11/17/2022.    PAST MEDICAL HISTORY: Past Medical History:  Diagnosis Date   GERD (gastroesophageal reflux disease)    Hypothyroidism    IBS (irritable bowel syndrome)    Mixed hyperlipidemia    Pernicious anemia    B12 injections every other week   Prediabetes     PAST SURGICAL HISTORY: Past Surgical History:  Procedure Laterality Date   ABDOMINAL HYSTERECTOMY     CHOLECYSTECTOMY N/A 10/24/2016   Procedure: LAPAROSCOPIC CHOLECYSTECTOMY;  Surgeon: Aviva Signs, MD;  Location: [facility];  Service: General;   Laterality: N/A;   COLONOSCOPY WITH PROPOFOL N/A 07/18/2017  Procedure: COLONOSCOPY WITH PROPOFOL;  Surgeon: Danie Binder, MD;  [facility];  Service: Endoscopy;  Laterality: N/A;  8:15am   COMBINED HYSTEROSCOPY DIAGNOSTIC / D&C  03/14/11   benign endometrial polyps   CYSTOSCOPY N/A 07/28/2015   Procedure: CYSTOSCOPY;  Surgeon: Anastasio Auerbach, MD;  Location: [facility];  Service: Gynecology;  Laterality: N/A;   DILATATION & CURETTAGE/HYSTEROSCOPY WITH MYOSURE N/A 12/02/2014   Procedure: DILATATION & CURETTAGE/HYSTEROSCOPY WITH MYOSURE;  Surgeon: Anastasio Auerbach, MD;  Location: [facility];  Service: Gynecology;  Laterality: N/A;   DILATATION & CURETTAGE/HYSTEROSCOPY WITH TRUECLEAR N/A 09/18/2013   Procedure: DILATATION & CURETTAGE/HYSTEROSCOPY WITH TRUCLEAR;  Surgeon: Anastasio Auerbach, MD;  Location: [facility];  Service: Gynecology;  Laterality: N/A;   DILATION AND CURETTAGE OF UTERUS  2007   LAPAROSCOPIC ASSISTED VAGINAL HYSTERECTOMY N/A 07/28/2015   Procedure: LAPAROSCOPIC ASSISTED VAGINAL HYSTERECTOMY;  Surgeon: Anastasio Auerbach, MD;  Location: [facility];  Service: Gynecology;  Laterality: N/A;   LAPAROSCOPIC BILATERAL SALPINGO OOPHERECTOMY Bilateral 07/28/2015   Procedure: LAPAROSCOPIC BILATERAL SALPINGO OOPHORECTOMY;  Surgeon: Anastasio Auerbach, MD;  Location: [facility];  Service: Gynecology;  Laterality: Bilateral;   LAPAROSCOPIC LYSIS OF ADHESIONS N/A 07/28/2015   Procedure: LAPAROSCOPIC LYSIS OF ADHESIONS;  Surgeon: Anastasio Auerbach, MD;  Location: [facility];  Service: Gynecology;  Laterality: N/A;   LIVER BIOPSY N/A 10/24/2016   Procedure: LIVER BIOPSY;  Surgeon: Aviva Signs, MD;  Location: [facility];  Service: General;  Laterality: N/A;   TUBAL LIGATION  1997    ALLERGIES: Allergies  Allergen Reactions   Latex Rash    Mouth ulcers after dental work with Latex gloves.   Omeprazole     RASH TO GENERIC BRAND   Shrimp [Shellfish Allergy] Swelling   Betadine [Povidone Iodine]  Itching and Rash   Eggs Or Egg-Derived Products Hives    FAMILY HISTORY: Family History  Problem Relation Age of Onset   Diabetes Mother    Hypertension Mother    Heart disease Mother    Hypertension Father    Diabetes Brother    Cancer Brother        prostate   Colon cancer Neg Hx     SOCIAL HISTORY: Social History   Tobacco Use   Smoking status: Never   Smokeless tobacco: Never   Tobacco comments:    Never smoked  Vaping Use   Vaping Use: Never used  Substance Use Topics   Alcohol use: No    Alcohol/week: 0.0 standard drinks of alcohol   Drug use: No   Social History   Social History Narrative   Not on file     OBJECTIVE: PHYSICAL EXAM: LMP 02/18/2011   General:*** General appearance: Awake and alert. No distress. Cooperative with exam.  Skin: No obvious rash or jaundice. HEENT: Atraumatic. Anicteric. Lungs: Non-labored breathing on room air  Heart: Regular Abdomen: Soft, non tender. Extremities: No edema. No obvious deformity.  Musculoskeletal: No obvious joint swelling. Psych: Affect appropriate.  Neurological: Mental Status: Alert. Speech fluent. No pseudobulbar affect Cranial Nerves: CNII: No RAPD. Visual fields grossly intact. CNIII, IV, VI: PERRL. No nystagmus. EOMI. CN V: Facial sensation intact bilaterally to fine touch. [place] clench strong. Jaw jerk***. CN VII: Facial muscles symmetric and strong. No ptosis at rest or after sustained upgaze***. CN VIII: Hearing grossly intact bilaterally. CN IX: No hypophonia. CN X: Palate elevates symmetrically. CN XI: Full strength shoulder shrug bilaterally. CN XII: Tongue protrusion full and midline. No atrophy or fasciculations.  No significant dysarthria*** Motor: Tone is ***. *** fasciculations in *** extremities. *** atrophy. No grip or percussive myotonia.***  Individual muscle group testing (MRC grade out of 5):  Movement     Neck flexion ***    Neck extension ***     Right Left    Shoulder abduction *** ***   Shoulder adduction *** ***   Shoulder ext rotation *** ***   Shoulder int rotation *** ***   Elbow flexion *** ***   Elbow extension *** ***   Wrist extension *** ***   Wrist flexion *** ***   Finger abduction - FDI *** ***   Finger abduction - ADM *** ***   Finger extension *** ***   Finger distal flexion - 2/3 *** ***   Finger distal flexion - 4/5 *** ***   Thumb flexion - FPL *** ***   Thumb abduction - APB *** ***    Hip flexion *** ***   Hip extension *** ***   Hip adduction *** ***   Hip abduction *** ***   Knee extension *** ***   Knee flexion *** ***   Dorsiflexion *** ***   Plantarflexion *** ***   Inversion *** ***   Eversion *** ***   Great toe extension *** ***   Great toe flexion *** ***     Reflexes:  Right Left   Bicep *** ***   Tricep *** ***   BrRad *** ***   Knee *** ***   Ankle *** ***    Pathological Reflexes: Babinski: *** response bilaterally*** Hoffman: *** Troemner: *** Pectoral: *** Palmomental: *** Facial: *** Midline tap: *** Sensation: Pinprick: *** Vibration: *** Temperature: *** Proprioception: *** Coordination: Intact finger-to- nose-finger bilaterally. Romberg negative.*** Gait: Able to rise from chair with arms crossed unassisted. Normal, narrow-based gait. Able to tandem walk. Able to walk on toes and heels.***  Lab and Test Review: Internal labs: ***  External labs: ***  Imaging: ***  ASSESSMENT: Rachel Branch is a 67 y.o. female who presents for evaluation of ***. *** has a relevant medical history of ***. *** neurological examination is pertinent for ***. Available diagnostic data is significant for ***. This constellation of symptoms and objective data would most likely localize to ***. ***  PLAN: -Blood work: *** ***  -Return to clinic ***  The impression above as well as the plan as outlined below were extensively discussed with the patient (in the company of ***) who  voiced understanding. All questions were answered to their satisfaction.  The patient was counseled on pertinent fall precautions per the printed material provided today, and as noted under the "Patient Instructions" section below.***  When available, results of the above investigations and possible further recommendations will be communicated to the patient via telephone/MyChart. Patient to call office if not contacted after expected testing turnaround time.   Total time spent reviewing records, interview, history/exam, documentation, and coordination of care on day of encounter:  *** min   Thank you for allowing me to participate in patient's care.  If I can answer any additional questions, I would be pleased to do so.  Kai Levins, MD   CC: Pablo Lawrence, NP [place] [address]  CC: Referring provider: Pablo Lawrence, NP [address] [place],  [place] [address]

## 2022-11-17 ENCOUNTER — Ambulatory Visit: Payer: 59 | Admitting: Neurology

## 2022-11-17 ENCOUNTER — Other Ambulatory Visit: Payer: Medicare Other

## 2022-11-17 ENCOUNTER — Ambulatory Visit: Payer: Medicare Other | Admitting: Neurology

## 2022-11-17 ENCOUNTER — Encounter: Payer: Self-pay | Admitting: Neurology

## 2022-11-17 VITALS — BP 149/76 | HR 72 | Ht 65.0 in | Wt 240.0 lb

## 2022-11-17 DIAGNOSIS — Z131 Encounter for screening for diabetes mellitus: Secondary | ICD-10-CM

## 2022-11-17 DIAGNOSIS — E538 Deficiency of other specified B group vitamins: Secondary | ICD-10-CM

## 2022-11-17 DIAGNOSIS — R202 Paresthesia of skin: Secondary | ICD-10-CM

## 2022-11-17 DIAGNOSIS — R200 Anesthesia of skin: Secondary | ICD-10-CM

## 2022-11-17 NOTE — Patient Instructions (Addendum)
I would like to investigate your symptoms further with the following: -Blood work -Repeat your EMG  I will be in touch when I have your results.  You can try Lidocaine cream as needed. Apply wear you have pain, tingling, or burning. Wear gloves to prevent your hands being numb. This can be bought over the counter at any drug store or online.   I would like to see you back in clinic in about 3 months.  The physicians and staff at [facility] are committed to providing excellent care. You may receive a survey requesting feedback about your experience at our office. We strive to receive "very good" responses to the survey questions. If you feel that your experience would prevent you from giving the office a "very good " response, please contact our office to try to remedy the situation. We may be reached at [phone]. Thank you for taking the time out of your busy day to complete the survey.  Kai Levins, MD [facility]  ELECTROMYOGRAM AND NERVE CONDUCTION STUDIES (EMG/NCS) INSTRUCTIONS  How to Prepare The neurologist conducting the EMG will need to know if you have certain medical conditions. Tell the neurologist and other EMG lab personnel if you: Have a pacemaker or any other electrical medical device Take blood-thinning medications Have hemophilia, a blood-clotting disorder that causes prolonged bleeding Bathing Take a shower or bath shortly before your exam in order to remove oils from your skin. Don't apply lotions or creams before the exam.  What to Expect You'll likely be asked to change into a hospital gown for the procedure and lie down on an examination table. The following explanations can help you understand what will happen during the exam.  Electrodes. The neurologist or a technician places surface electrodes at various locations on your skin depending on where you're experiencing symptoms. Or the neurologist may insert needle electrodes at different sites  depending on your symptoms.  Sensations. The electrodes will at times transmit a tiny electrical current that you may feel as a twinge or spasm. The needle electrode may cause discomfort or pain that usually ends shortly after the needle is removed. If you are concerned about discomfort or pain, you may want to talk to the neurologist about taking a short break during the exam.  Instructions. During the needle EMG, the neurologist will assess whether there is any spontaneous electrical activity when the muscle is at rest - activity that isn't present in healthy muscle tissue - and the degree of activity when you slightly contract the muscle.  He or she will give you instructions on resting and contracting a muscle at appropriate times. Depending on what muscles and nerves the neurologist is examining, he or she may ask you to change positions during the exam.  After your EMG You may experience some temporary, minor bruising where the needle electrode was inserted into your muscle. This bruising should fade within several days. If it persists, contact your primary care doctor.

## 2022-11-22 LAB — IMMUNOFIXATION ELECTROPHORESIS
IgG (Immunoglobin G), Serum: 1120 mg/dL (ref 600–1540)
IgM, Serum: 55 mg/dL (ref 50–300)
Immunoglobulin A: 301 mg/dL (ref 70–320)

## 2022-11-22 LAB — METHYLMALONIC ACID, SERUM: Methylmalonic Acid, Quant: 183 nmol/L (ref 87–318)

## 2022-11-22 LAB — VITAMIN B1: Vitamin B1 (Thiamine): 13 nmol/L (ref 8–30)

## 2022-11-22 LAB — HEMOGLOBIN A1C
Hgb A1c MFr Bld: 6.3 % of total Hgb — ABNORMAL HIGH (ref <5.7)
Mean Plasma Glucose: 134 mg/dL
eAG (mmol/L): 7.4 mmol/L

## 2022-11-22 LAB — HOMOCYSTEINE: Homocysteine: 7.5 umol/L (ref <10.4)

## 2022-12-07 ENCOUNTER — Ambulatory Visit: Payer: 59 | Admitting: Neurology

## 2022-12-12 ENCOUNTER — Encounter: Payer: Self-pay | Admitting: Neurology

## 2022-12-12 ENCOUNTER — Ambulatory Visit: Payer: Medicare Other | Admitting: Neurology

## 2022-12-12 DIAGNOSIS — R202 Paresthesia of skin: Secondary | ICD-10-CM

## 2022-12-12 DIAGNOSIS — M5412 Radiculopathy, cervical region: Secondary | ICD-10-CM

## 2022-12-12 DIAGNOSIS — E538 Deficiency of other specified B group vitamins: Secondary | ICD-10-CM

## 2022-12-12 DIAGNOSIS — R200 Anesthesia of skin: Secondary | ICD-10-CM

## 2022-12-12 DIAGNOSIS — G5601 Carpal tunnel syndrome, right upper limb: Secondary | ICD-10-CM

## 2022-12-12 DIAGNOSIS — Z131 Encounter for screening for diabetes mellitus: Secondary | ICD-10-CM

## 2022-12-12 NOTE — Procedures (Signed)
[facility]  [place], [place]  [place], [place] [address] Tel: [phone] Fax: [phone] Test Date:  12/12/2022  Patient: Rachel Branch DOB: 08/29/1956 Physician: Kai Levins, MD  Sex: Female Height: 5\' 5"  Ref Phys: Kai Levins, MD  ID#: [id number]   Technician:    History: This is a 67 year old female with numbness and tingling in legs and arms.  NCV & EMG Findings: Extensive electrodiagnostic evaluation of the right upper and lower limbs shows: Right sural, superficial peroneal/fibular, ulnar, and radial sensory responses are within normal limits. Right median sensory response shows prolonged distal peak latency (4.2 ms). Right peroneal/fibular (EDB), tibial (AH), median (APB), and ulnar (ADM) motor responses are within normal limits. Chronic motor axon loss changes without accompanying active degeneration changes are seen in the right first dorsal interosseous and extensor indicis proprius muscles. All other tested muscles are within normal limits with normal motor unit configuration and recruitment patterns.  Impression: This is an abnormal study. The findings are most consistent with the following: Evidence of a right median mononeuropathy at or distal to the wrist, consistent with carpal tunnel syndrome, mild in degree electrically. The residuals of an old intraspinal canal lesion (ie: motor radiculopathy) at the right C8 root or segment, mild in degree electrically. No electrodiagnostic evidence of a large fiber sensorimotor neuropathy. No electrodiagnostic evidence of a right lumbosacral (L3-S1) motor radiculopathy.    ___________________________ Kai Levins, MD    Nerve Conduction Studies Motor Nerve Results    Latency Amplitude F-Lat Segment Distance CV Comment  Site (ms) Norm (mV) Norm (ms)  (cm) (m/s) Norm   Right Fibular (EDB) Motor  Ankle 4.2  < 6.0 2.5  > 2.5        Bel fib head 10.8 - 2.4 -  Bel fib head-Ankle 32 48  > 40   Pop fossa  12.5 - 2.0 -  Pop fossa-Bel fib head 8 47 -   Right Median (APB) Motor  Wrist 3.8  < 4.0 9.4  > 5.0        Elbow 8.3 - 8.9 -  Elbow-Wrist 25.5 57  > 50   Right Tibial (AH) Motor  Ankle 3.7  < 6.0 12.9  > 4.0        Knee 13.5 - 9.4 -  Knee-Ankle - 45  > 40   Right Ulnar (ADM) Motor  Wrist 2.1  < 3.1 7.8  > 7.0        Bel elbow 5.8 - 7.2 -  Bel elbow-Wrist 22 59  > 50   Ab elbow 7.5 - 6.9 -  Ab elbow-Bel elbow 10 59 -    Sensory Sites    Neg Peak Lat Amplitude (O-P) Segment Distance Velocity Comment  Site (ms) Norm (V) Norm  (cm) (ms)   Right Median Sensory  Wrist-Dig II *4.2  < 3.8 16  > 10 Wrist-Dig II 13    Right Radial Sensory  Forearm-Wrist 2.0  < 2.8 20  > 10 Forearm-Wrist 10    Right Superficial Fibular Sensory  14 cm-Ankle 2.8  < 4.6 6  > 3 14 cm-Ankle 14    Right Sural Sensory  Calf-Lat mall 3.4  < 4.6 7  > 3 Calf-Lat mall 14    Right Ulnar Sensory  Wrist-Dig V 2.6  < 3.2 22  > 5 Wrist-Dig V 11     H-Reflex Results    M-Lat H Lat H Neg Amp H-M Lat  Site (ms) (  ms) Norm (mV) (ms)  Right Tibial H-Reflex  Pop fossa 3.6 -  < 35.0 - -   Electromyography   Side Muscle Ins.Act Fibs Fasc Recrt Amp Dur Poly Activation Comment  Right Tib ant Nml Nml Nml Nml Nml Nml Nml Nml N/A  Right Gastroc MH Nml Nml Nml Nml Nml Nml Nml Nml N/A  Right FDL Nml Nml Nml Nml Nml Nml Nml Nml N/A  Right Rectus fem Nml Nml Nml Nml Nml Nml Nml Nml N/A  Right Biceps fem SH Nml Nml Nml Nml Nml Nml Nml Nml N/A  Right Gluteus med Nml Nml Nml Nml Nml Nml Nml Nml N/A  Right FDI Nml Nml Nml *1- *1+ *1+ *1+ Nml N/A  Right EIP Nml Nml Nml *1- *1+ *1+ *1+ Nml N/A  Right Pronator teres Nml Nml Nml Nml Nml Nml Nml Nml N/A  Right Biceps Nml Nml Nml Nml Nml Nml Nml Nml N/A  Right Triceps lat hd Nml Nml Nml Nml Nml Nml Nml Nml N/A  Right Deltoid Nml Nml Nml Nml Nml Nml Nml Nml N/A  Right C7 PSP Nml Nml Nml Nml Nml Nml Nml Nml N/A      Waveforms:  Motor           Sensory              H-Reflex

## 2023-02-23 NOTE — Progress Notes (Deleted)
NEUROLOGY FOLLOW UP OFFICE NOTE  Rachel Branch [id number]  Subjective:  Rachel Branch is a 67 y.o. year old right-handed female with a medical history of pernicious anemia, iron deficiency anemia, vit D deficiency, hypothyroidism who we last saw on 11/17/22.  To briefly review: Patient has had pernicious anemia since she was in her 27s. In the past, she would have numbness and tingling and imbalance. She was tested by neurology and her B12 was found to be low. She started B12 injections and her symptoms resolved.    If patient does not give herself her B12 shot, she will start noticing symptoms. Her B12 has not be low recently.   She has had tingling in hands and feet since 08/2022 even though she was taking B12. It is her fingers (all) and middle of feet to toes. She even increased B12 but this did not help (maybe better). The feet are equal. Her right hand is worse than the left though. She also mentions that she now takes steps one at a time because she feels it is hard to raise her legs high enough.   Patient has occasional neck pain, but it does not radiate into her arms. She denies back pain. She has occasional leg cramps.   She does not report any constitutional symptoms like fever, night sweats, anorexia or unintentional weight loss.   EtOH use: None  Restrictive diet? No Family history of neuropathy/myopathy/neurology disease? Mother had diabetic neuropathy   Patient had an EMG about 40 years ago (when diagnosed with B12 problems).  Most recent Assessment and Plan (11/17/22): Rachel Branch is a 67 y.o. female who presents for evaluation of numbness and tingling in bilateral feet and hands. She has a relevant medical history of pernicious anemia, iron deficiency anemia, vit D deficiency, hypothyroidism. Her neurological examination shows no definitive abnormalities. Available diagnostic data is significant for B12 in 08/2022 of 996 and HbA1c in 11/2021 of 5.8. The etiology of  patient's symptoms is currently unclear. Although she carries a diagnosis of pernicious anemia which was clearly associated with symptoms in the past, the history is odd that missing a shot of B12 here and there over 40 years might cause symptoms. Also, her B12 has been normal of late though MMA and homocysteine have not been checked that I can see. I will do blood work today and get EMG to further evaluate.   PLAN: -Blood work: HbA1c, B1, IFE, MMA, homocysteine -EMG: PN (R > L) -Discussed medication, patient declined -Lidocaine PRN discussed  Since their last visit: Labs were significant for HbA1c of 6.3 (pre-diabetes). 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 I recommended wrist splinting for carpal tunnel. We also discussed the pain in her leg. She had some swelling in her leg that could be contributing, so I recommended she discuss with her primary care doctor.   ***  MEDICATIONS:  Outpatient Encounter Medications as of 03/02/2023  Medication Sig   Cholecalciferol (VITAMIN D) 2000 units tablet Take 2,000 Units by mouth daily.   Cyanocobalamin 1000 MCG/ML KIT Inject 1 mL as directed See admin instructions. Inject 1cc into skin every other week.   levothyroxine (SYNTHROID, LEVOTHROID) 112 MCG tablet TAKE 1 TABLET(112 MCG) BY MOUTH DAILY BEFORE BREAKFAST   simethicone (MYLICON) 125 MG chewable tablet Chew 125-250 mg by mouth every 6 (six) hours as needed for flatulence.  vitamin C (ASCORBIC ACID) 250 MG tablet Take 250 mg by mouth daily. 2 chewy a day   No facility-administered encounter medications on file as of 03/02/2023.    PAST MEDICAL HISTORY: Past Medical History:  Diagnosis Date   GERD (gastroesophageal reflux disease)    Hypothyroidism    IBS (irritable bowel syndrome)    Mixed hyperlipidemia    Pernicious anemia    B12 injections every other week    Prediabetes     PAST SURGICAL HISTORY: Past Surgical History:  Procedure Laterality Date   ABDOMINAL HYSTERECTOMY     CHOLECYSTECTOMY N/A 10/24/2016   Procedure: LAPAROSCOPIC CHOLECYSTECTOMY;  Surgeon: Franky Macho, MD;  Location: [facility];  Service: General;  Laterality: N/A;   COLONOSCOPY WITH PROPOFOL N/A 07/18/2017   Procedure: COLONOSCOPY WITH PROPOFOL;  Surgeon: West Bali, MD;  Location: [facility];  Service: Endoscopy;  Laterality: N/A;  8:15am   COMBINED HYSTEROSCOPY DIAGNOSTIC / D&C  03/14/11   benign endometrial polyps   CYSTOSCOPY N/A 07/28/2015   Procedure: CYSTOSCOPY;  Surgeon: Dara Lords, MD;  Location: [facility];  Service: Gynecology;  Laterality: N/A;   DILATATION & CURETTAGE/HYSTEROSCOPY WITH MYOSURE N/A 12/02/2014   Procedure: DILATATION & CURETTAGE/HYSTEROSCOPY WITH MYOSURE;  Surgeon: Dara Lords, MD;  Location: [facility];  Service: Gynecology;  Laterality: N/A;   DILATATION & CURETTAGE/HYSTEROSCOPY WITH TRUECLEAR N/A 09/18/2013   Procedure: DILATATION & CURETTAGE/HYSTEROSCOPY WITH TRUCLEAR;  Surgeon: Dara Lords, MD;  Location: [facility];  Service: Gynecology;  Laterality: N/A;   DILATION AND CURETTAGE OF UTERUS  2007   LAPAROSCOPIC ASSISTED VAGINAL HYSTERECTOMY N/A 07/28/2015   Procedure: LAPAROSCOPIC ASSISTED VAGINAL HYSTERECTOMY;  Surgeon: Dara Lords, MD;  Location: [facility];  Service: Gynecology;  Laterality: N/A;   LAPAROSCOPIC BILATERAL SALPINGO OOPHERECTOMY Bilateral 07/28/2015   Procedure: LAPAROSCOPIC BILATERAL SALPINGO OOPHORECTOMY;  Surgeon: Dara Lords, MD;  [facility]: [facility];  Service: Gynecology;  Laterality: Bilateral;   LAPAROSCOPIC LYSIS OF ADHESIONS N/A 07/28/2015   Procedure: LAPAROSCOPIC LYSIS OF ADHESIONS;  Surgeon: Dara Lords, MD;  Location: [facility];  Service: Gynecology;  Laterality: N/A;   LIVER BIOPSY N/A 10/24/2016   Procedure: LIVER BIOPSY;  Surgeon: Franky Macho, MD;  Location: [facility];  Service: General;   Laterality: N/A;   TUBAL LIGATION  1997    ALLERGIES: Allergies  Allergen Reactions   Latex Rash    Mouth ulcers after dental work with Latex gloves.   Omeprazole     RASH TO GENERIC BRAND   Shrimp [Shellfish Allergy] Swelling   Betadine [Povidone Iodine] Itching and Rash   Egg-Derived Products Hives    FAMILY HISTORY: Family History  Problem Relation Age of Onset   Diabetes Mother    Hypertension Mother    Heart disease Mother    Hypertension Father    Diabetes Brother    Cancer Brother        prostate   Colon cancer Neg Hx     SOCIAL HISTORY: Social History   Tobacco Use   Smoking status: Never   Smokeless tobacco: Never   Tobacco comments:    Never smoked  Vaping Use   Vaping Use: Never used  Substance Use Topics   Alcohol use: No    Alcohol/week: 0.0 standard drinks of alcohol   Drug use: No   Social History   Social History Narrative   Are you right handed or left handed? Right   Are you currently employed ? no   What is  your current occupation? retired   Do you live at home alone?   Who lives with you? Husband   What type of home do you live in: 1 story or 2 story? two    Caffeine 2-4 a day      Objective:  Vital Signs:  LMP 02/18/2011   ***  Labs and Imaging review: New results: 11/17/22: Homocysteine wnl HbA1c: 6.3 IFE: no M protein B1 wnl MMA mma  EMG (12/12/22): NCV & EMG Findings: Extensive electrodiagnostic evaluation of the right upper and lower limbs shows: Right sural, superficial peroneal/fibular, ulnar, and radial sensory responses are within normal limits. Right median sensory response shows prolonged distal peak latency (4.2 ms). Right peroneal/fibular (EDB), tibial (AH), median (APB), and ulnar (ADM) motor responses are within normal limits. Chronic motor axon loss changes without accompanying active degeneration changes are seen in the right first dorsal interosseous and extensor indicis proprius muscles. All other tested  muscles are within normal limits with normal motor unit configuration and recruitment patterns.   Impression: This is an abnormal study. The findings are most consistent with the following: Evidence of a right median mononeuropathy at or distal to the wrist, consistent with carpal tunnel syndrome, mild in degree electrically. The residuals of an old intraspinal canal lesion (ie: motor radiculopathy) at the right C8 root or segment, mild in degree electrically. No electrodiagnostic evidence of a large fiber sensorimotor neuropathy. No electrodiagnostic evidence of a right lumbosacral (L3-S1) motor radiculopathy.  Previously reviewed results: 09/06/22: B12: 996 Folate: 19.5 Vit D 31.5 CMP: significant for glucose of 114, alk phos 166, ALT 43 ANA: negative CBC wnl   HbA1c (12/14/21): 5.8   Imaging: Lumbar spine xray (07/11/13): FINDINGS:  There is no evidence of lumbar spine fracture. Alignment is normal.  Mild osteophyte formation is noted at L2-3 and L3-4 consistent with  minimal degenerative disc disease at these levels.   IMPRESSION:  Minimal degenerative disc disease is noted at L2-3 and L3-4. No  acute abnormality seen in the lumbar spine.   Assessment/Plan:  This is Rachel Branch, a 67 y.o. female with: ***   Plan: ***  Return to clinic in ***  Total time spent reviewing records, interview, history/exam, documentation, and coordination of care on day of encounter:  *** min  Jacquelyne Balint, MD

## 2023-03-02 ENCOUNTER — Ambulatory Visit: Payer: Medicare Other | Admitting: Neurology

## 2023-06-15 ENCOUNTER — Other Ambulatory Visit (HOSPITAL_COMMUNITY): Payer: Self-pay | Admitting: Adult Health Nurse Practitioner

## 2023-06-15 DIAGNOSIS — Z1231 Encounter for screening mammogram for malignant neoplasm of breast: Secondary | ICD-10-CM

## 2023-06-21 ENCOUNTER — Ambulatory Visit (HOSPITAL_COMMUNITY)
Admission: RE | Admit: 2023-06-21 | Discharge: 2023-06-21 | Disposition: A | Discharge status: Home / Self Care | Payer: Medicare Other | Source: Ambulatory Visit | Attending: Adult Health Nurse Practitioner | Admitting: Adult Health Nurse Practitioner

## 2023-06-21 DIAGNOSIS — Z1231 Encounter for screening mammogram for malignant neoplasm of breast: Secondary | ICD-10-CM | POA: Insufficient documentation

## 2023-07-05 ENCOUNTER — Other Ambulatory Visit (HOSPITAL_COMMUNITY): Payer: Self-pay | Admitting: Adult Health Nurse Practitioner

## 2023-07-05 DIAGNOSIS — R319 Hematuria, unspecified: Secondary | ICD-10-CM

## 2023-07-05 DIAGNOSIS — R200 Anesthesia of skin: Secondary | ICD-10-CM

## 2023-07-05 DIAGNOSIS — R1031 Right lower quadrant pain: Secondary | ICD-10-CM

## 2023-07-07 ENCOUNTER — Ambulatory Visit (HOSPITAL_COMMUNITY)
Admission: RE | Admit: 2023-07-07 | Discharge: 2023-07-07 | Disposition: A | Discharge status: Home / Self Care | Payer: Medicare Other | Source: Ambulatory Visit | Attending: Adult Health Nurse Practitioner | Admitting: Adult Health Nurse Practitioner

## 2023-07-07 DIAGNOSIS — R1031 Right lower quadrant pain: Secondary | ICD-10-CM | POA: Insufficient documentation

## 2023-07-07 DIAGNOSIS — R319 Hematuria, unspecified: Secondary | ICD-10-CM | POA: Diagnosis present

## 2023-07-07 DIAGNOSIS — R200 Anesthesia of skin: Secondary | ICD-10-CM | POA: Diagnosis present

## 2023-07-07 DIAGNOSIS — R202 Paresthesia of skin: Secondary | ICD-10-CM | POA: Insufficient documentation

## 2023-07-07 MED ORDER — IOHEXOL 9 MG/ML PO SOLN
ORAL | Status: AC
  Filled 2023-07-07: qty 1000

## 2023-08-14 ENCOUNTER — Ambulatory Visit: Payer: Medicare Other | Admitting: Urology

## 2023-08-14 ENCOUNTER — Encounter: Payer: Self-pay | Admitting: Urology

## 2023-08-14 VITALS — BP 162/81 | HR 76

## 2023-08-14 DIAGNOSIS — N3289 Other specified disorders of bladder: Secondary | ICD-10-CM

## 2023-08-14 DIAGNOSIS — R3915 Urgency of urination: Secondary | ICD-10-CM | POA: Diagnosis not present

## 2023-08-14 DIAGNOSIS — R3129 Other microscopic hematuria: Secondary | ICD-10-CM

## 2023-08-14 LAB — URINALYSIS, ROUTINE W REFLEX MICROSCOPIC
Bilirubin, UA: NEGATIVE
Glucose, UA: NEGATIVE
Ketones, UA: NEGATIVE
Leukocytes,UA: NEGATIVE
Nitrite, UA: NEGATIVE
Protein,UA: NEGATIVE
Specific Gravity, UA: 1.01 (ref 1.005–1.030)
Urobilinogen, Ur: 0.2 mg/dL (ref 0.2–1.0)
pH, UA: 6 (ref 5.0–7.5)

## 2023-08-14 LAB — MICROSCOPIC EXAMINATION: Bacteria, UA: NONE SEEN

## 2023-08-14 MED ORDER — GEMTESA 75 MG PO TABS: 1.0000 | ORAL_TABLET | Freq: Every day | ORAL | Status: DC

## 2023-08-14 NOTE — Progress Notes (Unsigned)
08/14/2023 8:51 AM   Melina Schools Cherlyn Labella 1956/08/08 [id number]  Referring provider: Roe Rutherford, NP [address] [place],  [place] [address]  Thick bladder wall   HPI: Ms Vanwieren is a [phone] here for evaluation of a thick bladder wall and microhematuria. She was seen 8 weeks ago for right flank pain and abdominal pain. She was treated for a UTI but the culture showed mixed urogenital flora. No tobacco abuse hx. No exposure risks. UA shows 3-10 RBCs. No dysuria. She is currently taking oxybutynin 5mg  daily for urinary urgency which works well but is causing bothersome drymouth   PMH: Past Medical History:  Diagnosis Date   GERD (gastroesophageal reflux disease)    Hypothyroidism    IBS (irritable bowel syndrome)    Mixed hyperlipidemia    Pernicious anemia    B12 injections every other week   Prediabetes     Surgical History: Past Surgical History:  Procedure Laterality Date   ABDOMINAL HYSTERECTOMY     CHOLECYSTECTOMY N/A 10/24/2016   Procedure: LAPAROSCOPIC CHOLECYSTECTOMY;  Surgeon: Franky Macho, MD;  [facility];  Service: General;  Laterality: N/A;   COLONOSCOPY WITH PROPOFOL N/A 07/18/2017   Procedure: COLONOSCOPY WITH PROPOFOL;  Surgeon: West Bali, MD;  [facility];  Service: Endoscopy;  Laterality: N/A;  8:15am   COMBINED HYSTEROSCOPY DIAGNOSTIC / D&C  03/14/11   benign endometrial polyps   CYSTOSCOPY N/A 07/28/2015   Procedure: CYSTOSCOPY;  Surgeon: Dara Lords, MD;  [facility];  Service: Gynecology;  Laterality: N/A;   DILATATION & CURETTAGE/HYSTEROSCOPY WITH MYOSURE N/A 12/02/2014   Procedure: DILATATION & CURETTAGE/HYSTEROSCOPY WITH MYOSURE;  Surgeon: Dara Lords, MD;  [facility];  Service: Gynecology;  Laterality: N/A;   DILATATION & CURETTAGE/HYSTEROSCOPY WITH TRUECLEAR N/A 09/18/2013   Procedure: DILATATION & CURETTAGE/HYSTEROSCOPY WITH TRUCLEAR;  Surgeon: Dara Lords, MD;  [facility];  Service:  Gynecology;  Laterality: N/A;   DILATION AND CURETTAGE OF UTERUS  2007   LAPAROSCOPIC ASSISTED VAGINAL HYSTERECTOMY N/A 07/28/2015   Procedure: LAPAROSCOPIC ASSISTED VAGINAL HYSTERECTOMY;  Surgeon: Dara Lords, MD;  [facility];  Service: Gynecology;  Laterality: N/A;   LAPAROSCOPIC BILATERAL SALPINGO OOPHERECTOMY Bilateral 07/28/2015   Procedure: LAPAROSCOPIC BILATERAL SALPINGO OOPHORECTOMY;  Surgeon: Dara Lords, MD;  [facility];  Service: Gynecology;  Laterality: Bilateral;   LAPAROSCOPIC LYSIS OF ADHESIONS N/A 07/28/2015   Procedure: LAPAROSCOPIC LYSIS OF ADHESIONS;  Surgeon: Dara Lords, MD;  [facility];  Service: Gynecology;  Laterality: N/A;   LIVER BIOPSY N/A 10/24/2016   Procedure: LIVER BIOPSY;  Surgeon: Franky Macho, MD;  Location: [facility];  Service: General;  Laterality: N/A;   TUBAL LIGATION  1997    Home Medications:  Allergies as of 08/14/2023       Reactions   Latex Rash   Mouth ulcers after dental work with Latex gloves.   Omeprazole    RASH TO GENERIC BRAND   Shrimp [shellfish Allergy] Swelling   Betadine [povidone Iodine] Itching, Rash   Egg-derived Products Hives        Medication List        Accurate as of August 14, 2023  8:51 AM. If you have any questions, ask your nurse or doctor.          Cyanocobalamin 1000 MCG/ML Kit Inject 1 mL as directed See admin instructions. Inject 1cc into skin every other week.   fexofenadine 180 MG tablet Commonly  known as: ALLEGRA Take 180 mg by mouth daily.   levothyroxine 112 MCG tablet Commonly known as: SYNTHROID TAKE 1 TABLET(112 MCG) BY MOUTH DAILY BEFORE BREAKFAST   oxybutynin 5 MG tablet Commonly known as: DITROPAN Take by mouth.   simethicone 125 MG chewable tablet Commonly known as: MYLICON Chew 125-250 mg by mouth every 6 (six) hours as needed for flatulence.   vitamin C 250 MG tablet Commonly known as: ASCORBIC ACID Take 250 mg by mouth daily. 2 chewy a day    Vitamin D 50 MCG (2000 UT) tablet Take 2,000 Units by mouth daily.        Allergies:  Allergies  Allergen Reactions   Latex Rash    Mouth ulcers after dental work with Latex gloves.   Omeprazole     RASH TO GENERIC BRAND   Shrimp [Shellfish Allergy] Swelling   Betadine [Povidone Iodine] Itching and Rash   Egg-Derived Products Hives    Family History: Family History  Problem Relation Age of Onset   Diabetes Mother    Hypertension Mother    Heart disease Mother    Hypertension Father    Diabetes Brother    Cancer Brother        prostate   Colon cancer Neg Hx     Social History:  reports that she has never smoked. She has never used smokeless tobacco. She reports that she does not drink alcohol and does not use drugs.  ROS: All other review of systems were reviewed and are negative except what is noted above in HPI  Physical Exam: BP (!) 162/81   Pulse 76   LMP 02/18/2011   Constitutional:  Alert and oriented, No acute distress. HEENT: [place] AT, moist mucus membranes.  Trachea midline, no masses. Cardiovascular: No clubbing, cyanosis, or edema. Respiratory: Normal respiratory effort, no increased work of breathing. GI: Abdomen is soft, nontender, nondistended, no abdominal masses GU: No CVA tenderness.  Lymph: No cervical or inguinal lymphadenopathy. Skin: No rashes, bruises or suspicious lesions. Neurologic: Grossly intact, no focal deficits, moving all 4 extremities. Psychiatric: Normal mood and affect.  Laboratory Data: Lab Results  Component Value Date   WBC 10.4 06/22/2020   HGB 12.8 06/22/2020   HCT 40.6 06/22/2020   MCV 90.4 06/22/2020   PLT 328 06/22/2020    Lab Results  Component Value Date   CREATININE 0.91 06/22/2020    No results found for: "PSA"  No results found for: "TESTOSTERONE"  Lab Results  Component Value Date   HGBA1C 6.3 (H) 11/17/2022    Urinalysis    Component Value Date/Time   COLORURINE YELLOW 12/03/2015 1053    APPEARANCEUR TURBID (A) 12/03/2015 1053   LABSPEC 1.024 12/03/2015 1053   PHURINE 5.5 12/03/2015 1053   GLUCOSEU NEGATIVE 12/03/2015 1053   HGBUR TRACE (A) 12/03/2015 1053   BILIRUBINUR NEGATIVE 12/03/2015 1053   KETONESUR NEGATIVE 12/03/2015 1053   PROTEINUR NEGATIVE 12/03/2015 1053   UROBILINOGEN 0.2 05/09/2012 1023   NITRITE NEGATIVE 12/03/2015 1053   LEUKOCYTESUR NEGATIVE 12/03/2015 1053    Lab Results  Component Value Date   BACTERIA NONE SEEN 12/03/2015    Pertinent Imaging: CT 07/07/2023: Images reviewed and discussed with the patient  No results found for this or any previous visit.  No results found for this or any previous visit.  No results found for this or any previous visit.  No results found for this or any previous visit.  No results found for this or any previous visit.  No valid procedures specified. No results found for this or any previous visit.  No results found for this or any previous visit.   Assessment & Plan:    1. Bladder wall thickening -Ct hematuria Office cystoscopy - Urinalysis, Routine w reflex microscopic - Cytology, urine  2. Microhematuria BMP CT hematuria Office cystoscopy - Cytology, urine  3. Urinary urgency We will trial gemtesa 75mg  daily  No follow-ups on file.  Wilkie Aye, MD  [facility] [place]

## 2023-08-14 NOTE — Patient Instructions (Signed)

## 2023-08-15 LAB — BASIC METABOLIC PANEL
BUN/Creatinine Ratio: 12 (ref 12–28)
BUN: 11 mg/dL (ref 8–27)
CO2: 28 mmol/L (ref 20–29)
Calcium: 9 mg/dL (ref 8.7–10.3)
Chloride: 104 mmol/L (ref 96–106)
Creatinine, Ser: 0.9 mg/dL (ref 0.57–1.00)
Glucose: 107 mg/dL — ABNORMAL HIGH (ref 70–99)
Potassium: 4.1 mmol/L (ref 3.5–5.2)
Sodium: 143 mmol/L (ref 134–144)
eGFR: 71 mL/min/{1.73_m2} (ref >59)

## 2023-08-15 LAB — CYTOLOGY, URINE

## 2023-09-01 ENCOUNTER — Ambulatory Visit (HOSPITAL_COMMUNITY)
Admission: RE | Admit: 2023-09-01 | Discharge: 2023-09-01 | Disposition: A | Discharge status: Home / Self Care | Payer: Medicare Other | Source: Ambulatory Visit | Attending: Urology | Admitting: Urology

## 2023-09-01 DIAGNOSIS — R3129 Other microscopic hematuria: Secondary | ICD-10-CM | POA: Insufficient documentation

## 2023-09-01 MED ORDER — IOHEXOL 300 MG/ML  SOLN
125.0000 mL | Freq: Once | INTRAMUSCULAR | Status: AC | PRN
  Administered 2023-09-01: 125 mL via INTRAVENOUS

## 2023-09-26 ENCOUNTER — Telehealth: Payer: Self-pay | Admitting: Urology

## 2023-09-26 NOTE — Telephone Encounter (Signed)
 Please let patient know to keep scheduled appt with MD.  He will go over imaging and decide if Cysto is needed at that time.

## 2023-09-26 NOTE — Telephone Encounter (Signed)
 Needs RX sent to [facility] for [place]

## 2023-09-26 NOTE — Telephone Encounter (Signed)
 Had CT scan done and wants to know if everything is ok and if she needs to come in January to Dr Ronne Binning

## 2023-10-16 ENCOUNTER — Other Ambulatory Visit: Payer: Medicare Other | Admitting: Urology

## 2023-11-08 ENCOUNTER — Encounter: Payer: Self-pay | Admitting: Urology

## 2023-11-08 ENCOUNTER — Ambulatory Visit: Payer: Medicare Other | Admitting: Urology

## 2023-11-08 VITALS — BP 120/72 | HR 97

## 2023-11-08 DIAGNOSIS — R3915 Urgency of urination: Secondary | ICD-10-CM

## 2023-11-08 DIAGNOSIS — R3129 Other microscopic hematuria: Secondary | ICD-10-CM

## 2023-11-08 LAB — URINALYSIS, ROUTINE W REFLEX MICROSCOPIC
Bilirubin, UA: NEGATIVE
Glucose, UA: NEGATIVE
Ketones, UA: NEGATIVE
Leukocytes,UA: NEGATIVE
Nitrite, UA: NEGATIVE
Protein,UA: NEGATIVE
Specific Gravity, UA: 1.02 (ref 1.005–1.030)
Urobilinogen, Ur: 1 mg/dL (ref 0.2–1.0)
pH, UA: 6 (ref 5.0–7.5)

## 2023-11-08 MED ORDER — GEMTESA 75 MG PO TABS: 1.0000 | ORAL_TABLET | Freq: Every day | ORAL | 11 refills | Status: DC

## 2023-11-08 MED ORDER — CIPROFLOXACIN HCL 500 MG PO TABS
500.0000 mg | ORAL_TABLET | Freq: Once | ORAL | Status: AC
  Administered 2023-11-08: 500 mg via ORAL

## 2023-11-08 NOTE — Progress Notes (Signed)
   11/08/23  CC: followup hematuria   HPI: Ms Marxen is a 41JW here for followup for hematuria and thickened bladder wall. CT hematuria showed no GU abnormalities Blood pressure 120/72, pulse 97, last menstrual period 02/18/2011. NED. A&Ox3.   No respiratory distress   Abd soft, NT, ND Normal external genitalia with patent urethral meatus  Cystoscopy Procedure Note  Patient identification was confirmed, informed consent was obtained, and patient was prepped using Betadine solution.  Lidocaine jelly was administered per urethral meatus.    Procedure: - Flexible cystoscope introduced, without any difficulty.   - Thorough search of the bladder revealed:    normal urethral meatus    normal urothelium    no stones    no ulcers     no tumors    no urethral polyps    no trabeculation  - Ureteral orifices were normal in position and appearance.  Post-Procedure: - Patient tolerated the procedure well  Assessment/ Plan: Continue gemtesa 75mg  daily. Followup 6 months   No follow-ups on file.  Wilkie Aye, MD

## 2023-11-08 NOTE — Patient Instructions (Signed)

## 2024-05-08 ENCOUNTER — Ambulatory Visit: Payer: Medicare Other | Admitting: Urology

## 2024-05-08 ENCOUNTER — Encounter: Payer: Self-pay | Admitting: Urology

## 2024-05-08 VITALS — BP 134/74 | HR 69

## 2024-05-08 DIAGNOSIS — R3129 Other microscopic hematuria: Secondary | ICD-10-CM

## 2024-05-08 DIAGNOSIS — R3915 Urgency of urination: Secondary | ICD-10-CM | POA: Diagnosis not present

## 2024-05-08 LAB — MICROSCOPIC EXAMINATION

## 2024-05-08 LAB — URINALYSIS, ROUTINE W REFLEX MICROSCOPIC
Bilirubin, UA: NEGATIVE
Glucose, UA: NEGATIVE
Ketones, UA: NEGATIVE
Nitrite, UA: NEGATIVE
Protein,UA: NEGATIVE
Specific Gravity, UA: 1.02 (ref 1.005–1.030)
Urobilinogen, Ur: 1 mg/dL (ref 0.2–1.0)
pH, UA: 6 (ref 5.0–7.5)

## 2024-05-08 MED ORDER — MIRABEGRON ER 25 MG PO TB24: 25.0000 mg | ORAL_TABLET | Freq: Every day | ORAL | 3 refills | Status: AC

## 2024-05-08 NOTE — Patient Instructions (Signed)

## 2024-05-08 NOTE — Progress Notes (Signed)
 05/08/2024 9:40 AM   Rachel Branch 11/28/1955 [id number]  Referring provider: Suanne Pfeiffer, NP [address] [place],  [place] [address]  Followup OAb and frequent UTI   HPI: Rachel Branch is a 68yo here for followup for OAb and frequent UTI. Since last visit she has had 4 UTIs. UA today shows RBCs and WBCs. She has nocturia 4-5x which are large volume. She has lower extremity edema. She was on gemtesa  which was expensive and was switched to oxybutynin which had her LUTS worse. Her constipation is worse.    PMH: Past Medical History:  Diagnosis Date   GERD (gastroesophageal reflux disease)    Hypothyroidism    IBS (irritable bowel syndrome)    Mixed hyperlipidemia    Pernicious anemia    B12 injections every other week   Prediabetes     Surgical History: Past Surgical History:  Procedure Laterality Date   ABDOMINAL HYSTERECTOMY     CHOLECYSTECTOMY N/A 10/24/2016   Procedure: LAPAROSCOPIC CHOLECYSTECTOMY;  Surgeon: Rachel Budge, MD;  Location: [facility];  Service: General;  Laterality: N/A;   COLONOSCOPY WITH PROPOFOL  N/A 07/18/2017   Procedure: COLONOSCOPY WITH PROPOFOL ;  Surgeon: Rachel Margo CROME, MD;  [facility];  Service: Endoscopy;  Laterality: N/A;  8:15am   COMBINED HYSTEROSCOPY DIAGNOSTIC / D&C  03/14/11   benign endometrial polyps   CYSTOSCOPY N/A 07/28/2015   Procedure: CYSTOSCOPY;  Surgeon: Rachel SHAUNNA Organ, MD;  [facility]: [facility];  Service: Gynecology;  Laterality: N/A;   DILATATION & CURETTAGE/HYSTEROSCOPY WITH MYOSURE N/A 12/02/2014   Procedure: DILATATION & CURETTAGE/HYSTEROSCOPY WITH MYOSURE;  Surgeon: Rachel SHAUNNA Organ, MD;  [facility]: [facility];  Service: Gynecology;  Laterality: N/A;   DILATATION & CURETTAGE/HYSTEROSCOPY WITH TRUECLEAR N/A 09/18/2013   Procedure: DILATATION & CURETTAGE/HYSTEROSCOPY WITH TRUCLEAR;  Surgeon: Rachel SHAUNNA Organ, MD;  [facility]: [facility];  Service: Gynecology;  Laterality: N/A;   DILATION AND CURETTAGE OF UTERUS   2007   LAPAROSCOPIC ASSISTED VAGINAL HYSTERECTOMY N/A 07/28/2015   Procedure: LAPAROSCOPIC ASSISTED VAGINAL HYSTERECTOMY;  Surgeon: Rachel SHAUNNA Organ, MD;  [facility];  Service: Gynecology;  Laterality: N/A;   LAPAROSCOPIC BILATERAL SALPINGO OOPHERECTOMY Bilateral 07/28/2015   Procedure: LAPAROSCOPIC BILATERAL SALPINGO OOPHORECTOMY;  Surgeon: Rachel SHAUNNA Organ, MD;  [facility];  Service: Gynecology;  Laterality: Bilateral;   LAPAROSCOPIC LYSIS OF ADHESIONS N/A 07/28/2015   Procedure: LAPAROSCOPIC LYSIS OF ADHESIONS;  Surgeon: Rachel SHAUNNA Organ, MD;  [facility];  Service: Gynecology;  Laterality: N/A;   LIVER BIOPSY N/A 10/24/2016   Procedure: LIVER BIOPSY;  Surgeon: Rachel Budge, MD;  Location: [facility];  Service: General;  Laterality: N/A;   TUBAL LIGATION  1997    Home Medications:  Allergies as of 05/08/2024       Reactions   Latex Rash   Mouth ulcers after dental work with Latex gloves.   Omeprazole     RASH TO GENERIC BRAND   Shrimp [shellfish Allergy] Swelling   Betadine  [povidone Iodine ] Itching, Rash   Egg-derived Products Hives        Medication List        Accurate as of May 08, 2024  9:40 AM. If you have any questions, ask your nurse or doctor.          STOP taking these medications    fexofenadine 180 MG tablet Commonly known as: ALLEGRA   Gemtesa  75 MG Tabs Generic drug: Vibegron    Vitamin D  50 MCG (2000 UT) tablet  TAKE these medications    Cyanocobalamin 1000 MCG/ML Kit Inject 1 mL as directed See admin instructions. Inject 1cc into skin every other week.   levothyroxine  112 MCG tablet Commonly known as: SYNTHROID  TAKE 1 TABLET(112 MCG) BY MOUTH DAILY BEFORE BREAKFAST   oxybutynin 15 MG 24 hr tablet Commonly known as: DITROPAN XL Take 15 mg by mouth.   simethicone 125 MG chewable tablet Commonly known as: MYLICON Chew 125-250 mg by mouth every 6 (six) hours as needed for flatulence.   vitamin C 250 MG  tablet Commonly known as: ASCORBIC ACID Take 250 mg by mouth daily. 2 chewy a day        Allergies:  Allergies  Allergen Reactions   Latex Rash    Mouth ulcers after dental work with Latex gloves.   Omeprazole      RASH TO GENERIC BRAND   Shrimp [Shellfish Allergy] Swelling   Betadine  [Povidone Iodine ] Itching and Rash   Egg-Derived Products Hives    Family History: Family History  Problem Relation Age of Onset   Diabetes Mother    Hypertension Mother    Heart disease Mother    Hypertension Father    Diabetes Brother    Cancer Brother        prostate   Colon cancer Neg Hx     Social History:  reports that she has never smoked. She has never used smokeless tobacco. She reports that she does not drink alcohol and does not use drugs.  ROS: All other review of systems were reviewed and are negative except what is noted above in HPI  Physical Exam: BP 134/74   Pulse 69   LMP 02/18/2011   Constitutional:  Alert and oriented, No acute distress. HEENT: [place] AT, moist mucus membranes.  Trachea midline, no masses. Cardiovascular: No clubbing, cyanosis, or edema. Respiratory: Normal respiratory effort, no increased work of breathing. GI: Abdomen is soft, nontender, nondistended, no abdominal masses GU: No CVA tenderness.  Lymph: No cervical or inguinal lymphadenopathy. Skin: No rashes, bruises or suspicious lesions. Neurologic: Grossly intact, no focal deficits, moving all 4 extremities. Psychiatric: Normal mood and affect.  Laboratory Data: Lab Results  Component Value Date   WBC 10.4 06/22/2020   HGB 12.8 06/22/2020   HCT 40.6 06/22/2020   MCV 90.4 06/22/2020   PLT 328 06/22/2020    Lab Results  Component Value Date   CREATININE 0.90 08/14/2023    No results found for: PSA  No results found for: TESTOSTERONE  Lab Results  Component Value Date   HGBA1C 6.3 (H) 11/17/2022    Urinalysis    Component Value Date/Time   COLORURINE YELLOW 12/03/2015  1053   APPEARANCEUR Clear 11/08/2023 0913   LABSPEC 1.024 12/03/2015 1053   PHURINE 5.5 12/03/2015 1053   GLUCOSEU Negative 11/08/2023 0913   HGBUR TRACE (A) 12/03/2015 1053   BILIRUBINUR Negative 11/08/2023 0913   KETONESUR NEGATIVE 12/03/2015 1053   PROTEINUR Negative 11/08/2023 0913   PROTEINUR NEGATIVE 12/03/2015 1053   UROBILINOGEN 0.2 05/09/2012 1023   NITRITE Negative 11/08/2023 0913   NITRITE NEGATIVE 12/03/2015 1053   LEUKOCYTESUR Negative 11/08/2023 0913    Lab Results  Component Value Date   LABMICR Comment 11/08/2023   WBCUA 0-5 08/14/2023   LABEPIT 0-10 08/14/2023   BACTERIA None seen 08/14/2023    Pertinent Imaging:  No results found for this or any previous visit.  No results found for this or any previous visit.  No results found for this or any  previous visit.  No results found for this or any previous visit.  No results found for this or any previous visit.  No results found for this or any previous visit.  Results for orders placed in visit on 08/14/23  CT HEMATURIA WORKUP  Narrative CLINICAL DATA:  Microhematuria  EXAM: CT ABDOMEN AND PELVIS WITHOUT AND WITH CONTRAST  TECHNIQUE: Multidetector CT imaging of the abdomen and pelvis was performed following the standard protocol before and following the bolus administration of intravenous contrast.  RADIATION DOSE REDUCTION: This exam was performed according to the departmental dose-optimization program which includes automated exposure control, adjustment of the mA and/or kV according to patient size and/or use of iterative reconstruction technique.  CONTRAST:  OMNIPAQUE  IOHEXOL  300 MG/ML  SOLN  COMPARISON:  CT abdomen/pelvis dated 07/07/2023  FINDINGS: Lower chest: Mild scarring at the lung bases.  Hepatobiliary: Liver is within normal limits.  Status post cholecystectomy. No intrahepatic or extrahepatic duct dilatation.  Pancreas: Within normal limits.  Spleen: Within  normal limits.  Adrenals/Urinary Tract: Adrenal glands are within normal limits.  Kidneys are within normal limits. No renal, ureteral, or bladder calculi. No hydronephrosis.  On delayed imaging, there are no filling defects in the bilateral opacified proximal collecting systems, ureters, or bladder.  Bladder is underdistended but unremarkable.  Stomach/Bowel: Stomach is notable for a small hiatal hernia.  No evidence of bowel obstruction.  Appendix is within normal limits (series 4/image 68).  No colonic wall thickening or inflammatory changes.  Vascular/Lymphatic: No evidence of abdominal aortic aneurysm.  Atherosclerotic calcifications of the abdominal aorta and branch vessels, although vessels remain patent.  No suspicious abdominopelvic lymphadenopathy.  Reproductive: Status post hysterectomy.  No adnexal masses.  Other: No abdominopelvic ascites.  Two small fat containing right paramidline ventral hernias (series 4/images 47 and 53).  Musculoskeletal: Visualized osseous structures are within normal limits.  IMPRESSION: No CT findings to account for the patient's hematuria.  Additional postsurgical and ancillary findings as above.   Electronically Signed By: Pinkie Pebbles M.D. On: 09/10/2023 03:10  No results found for this or any previous visit.   Assessment & Plan:    1. Microhematuria (Primary) Followup 6 months with UA - Urinalysis, Routine w reflex microscopic  2. Urinary urgency/nocturia -Patient instructed to start wearing compression stockings. - Urinalysis, Routine w reflex microscopic   No follow-ups on file.  Belvie Clara, MD  [facility] [place]

## 2024-05-14 ENCOUNTER — Other Ambulatory Visit (HOSPITAL_COMMUNITY): Payer: Self-pay | Admitting: Adult Health Nurse Practitioner

## 2024-05-14 DIAGNOSIS — Z1231 Encounter for screening mammogram for malignant neoplasm of breast: Secondary | ICD-10-CM

## 2024-06-07 ENCOUNTER — Telehealth: Payer: Self-pay

## 2024-06-07 NOTE — Telephone Encounter (Signed)
 Dysuria  Patient called with c/o dysuria x since May days.  Pain: aching  Severity:4/10  Pt have UA/ urine culture with PCP 06/03/2024. Urine culture results E.Coli. PCP started pt with Sulfa  Antibiotic (Macrobid) and you have pick up medication b/c wanted be ready for few hours.  Associated Signs and Symptoms:  Fever: noTemp.0 Chills: no Hematuria: no Urgency: yes Frequency: yes Hesitancy:no Incontinence: yes Nausea: no Vomiting: no  Urologic History:  Any Recent Urologic Surgeries or Procedures:no Recurrent UTI's:yes Cystitis: no  Prostatitis:no Kidney or Bladder Stones: no Plan: Walk-in Clinic: no Appointment w/Physician: [no Lab visit scheduled for urine drop off: Yes Advice given:  Do you take on daily medications for UTI suppression No

## 2024-06-24 ENCOUNTER — Ambulatory Visit (HOSPITAL_COMMUNITY)
Admission: RE | Admit: 2024-06-24 | Discharge: 2024-06-24 | Disposition: A | Discharge status: Home / Self Care | Source: Ambulatory Visit | Attending: Adult Health Nurse Practitioner | Admitting: Adult Health Nurse Practitioner

## 2024-06-24 DIAGNOSIS — Z1231 Encounter for screening mammogram for malignant neoplasm of breast: Secondary | ICD-10-CM | POA: Insufficient documentation

## 2024-08-12 ENCOUNTER — Encounter: Payer: Self-pay | Admitting: Urology

## 2024-08-12 ENCOUNTER — Ambulatory Visit: Admitting: Urology

## 2024-08-12 VITALS — BP 131/80 | HR 71

## 2024-08-12 DIAGNOSIS — R3915 Urgency of urination: Secondary | ICD-10-CM | POA: Diagnosis not present

## 2024-08-12 DIAGNOSIS — R3129 Other microscopic hematuria: Secondary | ICD-10-CM | POA: Diagnosis not present

## 2024-08-12 DIAGNOSIS — N3021 Other chronic cystitis with hematuria: Secondary | ICD-10-CM

## 2024-08-12 LAB — URINALYSIS, ROUTINE W REFLEX MICROSCOPIC
Bilirubin, UA: NEGATIVE
Glucose, UA: NEGATIVE
Ketones, UA: NEGATIVE
Leukocytes,UA: NEGATIVE
Nitrite, UA: NEGATIVE
Protein,UA: NEGATIVE
Specific Gravity, UA: <1.005 — ABNORMAL LOW (ref 1.005–1.030)
Urobilinogen, Ur: 0.2 mg/dL (ref 0.2–1.0)
pH, UA: 6 (ref 5.0–7.5)

## 2024-08-12 LAB — MICROSCOPIC EXAMINATION: Bacteria, UA: NONE SEEN

## 2024-08-12 NOTE — Patient Instructions (Signed)
 Blood in the Pee (Hematuria) in Adults: What to Know  Hematuria is blood in the pee. You may be able to see blood in the pee. In some cases, a health care provider may find blood with a test.  Blood in the pee can be caused by infections of the kidney, bladder, or the urethra. The urethra is the tube that drains pee from the bladder.  Other causes may include: Kidney stones. Infection of the prostate. Cancer. Too much calcium in the pee. Conditions that are passed from parent to child. Too much exercise. Infections can be treated with medicine. A kidney stone will usually leave your body when you pee. If infections or kidney stones didn't cause the blood in the urine, then more tests may be needed. It is very important to tell your provider about any blood in your pee, even if you have no pain or the blood stops with no treatment. Blood in the pee can be a sign of a very serious problem, such as cancer. Follow these instructions at home: Medicines Take your medicines only as told. If you were given antibiotics, take them as told. Do not stop taking them even if you start to feel better. Eating and drinking Drink more fluids as told. Aim to drink 3-4 quarts (2.8-3.8 L) a day. Avoid caffeine, tea, and carbonated drinks. These can bother the bladder. Avoid alcohol if a female because it may irritate the prostate. General instructions If you have been diagnosed with a kidney stone, strain your pee to catch the stone if told by your provider. Empty your bladder often. Avoid holding pee for a long time. If you're female, make sure that: You wipe from front to back after using the bathroom. You use each piece of toilet paper only once. You pee before and after sex. It's up to you to get the results of any tests. Ask when your results will be ready and how to get them. You may need to call or meet with your provider to get your results. Keep all follow-up visits. Your provider will need to know  about any changes or any new symptoms. Contact a health care provider if: Your symptoms don't get better after 3 days. Your symptoms get worse. You have back pain or belly pain. You have a fever or chills. You throw up or feel like you may throw up. You throw up every time you take medicine. Get help right away if: You pass blood clots in your pee. You pass out. These symptoms may be an emergency. Call 911 right away. Do not wait to see if the symptoms will go away. Do not drive yourself to the hospital. This information is not intended to replace advice given to you by your health care provider. Make sure you discuss any questions you have with your health care provider. Document Revised: 06/22/2023 Document Reviewed: 06/01/2023 Elsevier Patient Education  2024 [employer].

## 2024-08-12 NOTE — Progress Notes (Signed)
 08/12/2024 9:16 AM   Rachel Branch 24-Aug-1956 [id number]  Referring provider: Suanne Pfeiffer, NP [address] [place] [place],  [place] [address]  Followup frequent UTI   HPI: Rachel Branch is a 68yo here for followup for frequent UTI and urinary urgency. She had 2 UTIs since last visit but recently her UTIs resolved. She is on D-mannose and cranberry supplement.  She stopped oxybutynin and could not take mirabegron  due to cost. No gross hematuria. No dysuria. UA shows 11-20 RBCs   PMH: Past Medical History:  Diagnosis Date   GERD (gastroesophageal reflux disease)    Hypothyroidism    IBS (irritable bowel syndrome)    Mixed hyperlipidemia    Pernicious anemia    B12 injections every other week   Prediabetes     Surgical History: Past Surgical History:  Procedure Laterality Date   ABDOMINAL HYSTERECTOMY     CHOLECYSTECTOMY N/A 10/24/2016   Procedure: LAPAROSCOPIC CHOLECYSTECTOMY;  Surgeon: Oneil Budge, MD;  Location: [facility];  Service: General;  Laterality: N/A;   COLONOSCOPY WITH PROPOFOL  N/A 07/18/2017   Procedure: COLONOSCOPY WITH PROPOFOL ;  Surgeon: Harvey Margo CROME, MD;  [facility];  Service: Endoscopy;  Laterality: N/A;  8:15am   COMBINED HYSTEROSCOPY DIAGNOSTIC / D&C  03/14/11   benign endometrial polyps   CYSTOSCOPY N/A 07/28/2015   Procedure: CYSTOSCOPY;  Surgeon: Evalene SHAUNNA Organ, MD;  [facility]: [facility];  Service: Gynecology;  Laterality: N/A;   DILATATION & CURETTAGE/HYSTEROSCOPY WITH MYOSURE N/A 12/02/2014   Procedure: DILATATION & CURETTAGE/HYSTEROSCOPY WITH MYOSURE;  Surgeon: Evalene SHAUNNA Organ, MD;  [facility];  Service: Gynecology;  Laterality: N/A;   DILATATION & CURETTAGE/HYSTEROSCOPY WITH TRUECLEAR N/A 09/18/2013   Procedure: DILATATION & CURETTAGE/HYSTEROSCOPY WITH TRUCLEAR;  Surgeon: Evalene SHAUNNA Organ, MD;  [facility];  Service: Gynecology;  Laterality: N/A;   DILATION AND CURETTAGE OF UTERUS  2007   LAPAROSCOPIC ASSISTED VAGINAL  HYSTERECTOMY N/A 07/28/2015   Procedure: LAPAROSCOPIC ASSISTED VAGINAL HYSTERECTOMY;  Surgeon: Evalene SHAUNNA Organ, MD;  [facility];  Service: Gynecology;  Laterality: N/A;   LAPAROSCOPIC BILATERAL SALPINGO OOPHERECTOMY Bilateral 07/28/2015   Procedure: LAPAROSCOPIC BILATERAL SALPINGO OOPHORECTOMY;  Surgeon: Evalene SHAUNNA Organ, MD;  [facility];  Service: Gynecology;  Laterality: Bilateral;   LAPAROSCOPIC LYSIS OF ADHESIONS N/A 07/28/2015   Procedure: LAPAROSCOPIC LYSIS OF ADHESIONS;  Surgeon: Evalene SHAUNNA Organ, MD;  [facility];  Service: Gynecology;  Laterality: N/A;   LIVER BIOPSY N/A 10/24/2016   Procedure: LIVER BIOPSY;  Surgeon: Oneil Budge, MD;  Location: [facility];  Service: General;  Laterality: N/A;   TUBAL LIGATION  1997    Home Medications:  Allergies as of 08/12/2024       Reactions   Latex Rash   Mouth ulcers after dental work with Latex gloves.   Omeprazole     RASH TO GENERIC BRAND   Shrimp [shellfish Allergy] Swelling   Betadine  [povidone Iodine ] Itching, Rash   Egg Protein-containing Drug Products Hives        Medication List        Accurate as of August 12, 2024  9:16 AM. If you have any questions, ask your nurse or doctor.          STOP taking these medications    oxybutynin 15 MG 24 hr tablet Commonly known as: DITROPAN XL       TAKE these medications    cetirizine 5 MG tablet Commonly known as: ZYRTEC Take 5 mg by  mouth daily as needed for allergies.   Cyanocobalamin 1000 MCG/ML Kit Inject 1 mL as directed See admin instructions. Inject 1cc into skin every other week.   levothyroxine  112 MCG tablet Commonly known as: SYNTHROID  TAKE 1 TABLET(112 MCG) BY MOUTH DAILY BEFORE BREAKFAST   mirabegron  ER 25 MG Tb24 tablet Commonly known as: MYRBETRIQ  Take 1 tablet (25 mg total) by mouth daily.   simethicone 125 MG chewable tablet Commonly known as: MYLICON Chew 125-250 mg by mouth every 6 (six) hours as needed for flatulence.    vitamin C 250 MG tablet Commonly known as: ASCORBIC ACID Take 250 mg by mouth daily. 2 chewy a day        Allergies:  Allergies  Allergen Reactions   Latex Rash    Mouth ulcers after dental work with Latex gloves.   Omeprazole      RASH TO GENERIC BRAND   Shrimp [Shellfish Allergy] Swelling   Betadine  [Povidone Iodine ] Itching and Rash   Egg Protein-Containing Drug Products Hives    Family History: Family History  Problem Relation Age of Onset   Diabetes Mother    Hypertension Mother    Heart disease Mother    Hypertension Father    Diabetes Brother    Cancer Brother        prostate   Colon cancer Neg Hx     Social History:  reports that she has never smoked. She has never used smokeless tobacco. She reports that she does not drink alcohol and does not use drugs.  ROS: All other review of systems were reviewed and are negative except what is noted above in HPI  Physical Exam: BP 131/80   Pulse 71   LMP 02/18/2011   Constitutional:  Alert and oriented, No acute distress. HEENT: [place] AT, moist mucus membranes.  Trachea midline, no masses. Cardiovascular: No clubbing, cyanosis, or edema. Respiratory: Normal respiratory effort, no increased work of breathing. GI: Abdomen is soft, nontender, nondistended, no abdominal masses GU: No CVA tenderness.  Lymph: No cervical or inguinal lymphadenopathy. Skin: No rashes, bruises or suspicious lesions. Neurologic: Grossly intact, no focal deficits, moving all 4 extremities. Psychiatric: Normal mood and affect.  Laboratory Data: Lab Results  Component Value Date   WBC 10.4 06/22/2020   HGB 12.8 06/22/2020   HCT 40.6 06/22/2020   MCV 90.4 06/22/2020   PLT 328 06/22/2020    Lab Results  Component Value Date   CREATININE 0.90 08/14/2023    No results found for: PSA  No results found for: TESTOSTERONE  Lab Results  Component Value Date   HGBA1C 6.3 (H) 11/17/2022    Urinalysis    Component Value Date/Time    COLORURINE YELLOW 12/03/2015 1053   APPEARANCEUR Clear 05/08/2024 0854   LABSPEC 1.024 12/03/2015 1053   PHURINE 5.5 12/03/2015 1053   GLUCOSEU Negative 05/08/2024 0854   HGBUR TRACE (A) 12/03/2015 1053   BILIRUBINUR Negative 05/08/2024 0854   KETONESUR NEGATIVE 12/03/2015 1053   PROTEINUR Negative 05/08/2024 0854   PROTEINUR NEGATIVE 12/03/2015 1053   UROBILINOGEN 0.2 05/09/2012 1023   NITRITE Negative 05/08/2024 0854   NITRITE NEGATIVE 12/03/2015 1053   LEUKOCYTESUR 1+ (A) 05/08/2024 0854    Lab Results  Component Value Date   LABMICR See below: 05/08/2024   WBCUA 6-10 (A) 05/08/2024   LABEPIT 0-10 05/08/2024   BACTERIA Few (A) 05/08/2024    Pertinent Imaging:  No results found for this or any previous visit.  No results found for this or any  previous visit.  No results found for this or any previous visit.  No results found for this or any previous visit.  No results found for this or any previous visit.  No results found for this or any previous visit.  Results for orders placed in visit on 08/14/23  CT HEMATURIA WORKUP  Narrative CLINICAL DATA:  Microhematuria  EXAM: CT ABDOMEN AND PELVIS WITHOUT AND WITH CONTRAST  TECHNIQUE: Multidetector CT imaging of the abdomen and pelvis was performed following the standard protocol before and following the bolus administration of intravenous contrast.  RADIATION DOSE REDUCTION: This exam was performed according to the departmental dose-optimization program which includes automated exposure control, adjustment of the mA and/or kV according to patient size and/or use of iterative reconstruction technique.  CONTRAST:  OMNIPAQUE  IOHEXOL  300 MG/ML  SOLN  COMPARISON:  CT abdomen/pelvis dated 07/07/2023  FINDINGS: Lower chest: Mild scarring at the lung bases.  Hepatobiliary: Liver is within normal limits.  Status post cholecystectomy. No intrahepatic or extrahepatic duct dilatation.  Pancreas: Within  normal limits.  Spleen: Within normal limits.  Adrenals/Urinary Tract: Adrenal glands are within normal limits.  Kidneys are within normal limits. No renal, ureteral, or bladder calculi. No hydronephrosis.  On delayed imaging, there are no filling defects in the bilateral opacified proximal collecting systems, ureters, or bladder.  Bladder is underdistended but unremarkable.  Stomach/Bowel: Stomach is notable for a small hiatal hernia.  No evidence of bowel obstruction.  Appendix is within normal limits (series 4/image 68).  No colonic wall thickening or inflammatory changes.  Vascular/Lymphatic: No evidence of abdominal aortic aneurysm.  Atherosclerotic calcifications of the abdominal aorta and branch vessels, although vessels remain patent.  No suspicious abdominopelvic lymphadenopathy.  Reproductive: Status post hysterectomy.  No adnexal masses.  Other: No abdominopelvic ascites.  Two small fat containing right paramidline ventral hernias (series 4/images 47 and 53).  Musculoskeletal: Visualized osseous structures are within normal limits.  IMPRESSION: No CT findings to account for the patient's hematuria.  Additional postsurgical and ancillary findings as above.   Electronically Signed By: Pinkie Pebbles M.D. On: 09/10/2023 03:10  No results found for this or any previous visit.   Assessment & Plan:    1. Microhematuria (Primary) Followup 1 year  - Urinalysis, Routine w reflex microscopic  2. Urinary urgency -patient defers therapy at this time  3. Chronic cystitis with hematuria Continue D-mannose and cranberry supplement.    No follow-ups on file.  Belvie Clara, MD  [facility] [place]

## 2025-08-11 ENCOUNTER — Ambulatory Visit: Admitting: Urology
